# Patient Record
Sex: Female | Born: 1990 | Race: White | Hispanic: No | Marital: Single | Smoking: Former smoker
Health system: Southern US, Community
[De-identification: ages and names within clinical notes are randomized; demographics above are authoritative.]

## PROBLEM LIST (undated history)

## (undated) DIAGNOSIS — Z8759 Personal history of other complications of pregnancy, childbirth and the puerperium: Secondary | ICD-10-CM

## (undated) DIAGNOSIS — O24419 Gestational diabetes mellitus in pregnancy, unspecified control: Secondary | ICD-10-CM

## (undated) HISTORY — DX: Personal history of other complications of pregnancy, childbirth and the puerperium: Z87.59

## (undated) HISTORY — DX: Gestational diabetes mellitus in pregnancy, unspecified control: O24.419

---

## 2010-08-21 ENCOUNTER — Emergency Department: Payer: Self-pay | Admitting: Emergency Medicine

## 2011-11-23 IMAGING — US US OB < 14 WEEKS - US OB TV
1 series · 17 of 28 positions shown · non-contrast
Comparison: none

REASON FOR EXAM: pregnancy, vaginal bleeding and pain
COMMENTS:   May transport without cardiac monitor

[Series 1: us ob < 14 weeks - us ob tv · 17 of 126 slices shown]
[im 1/126]
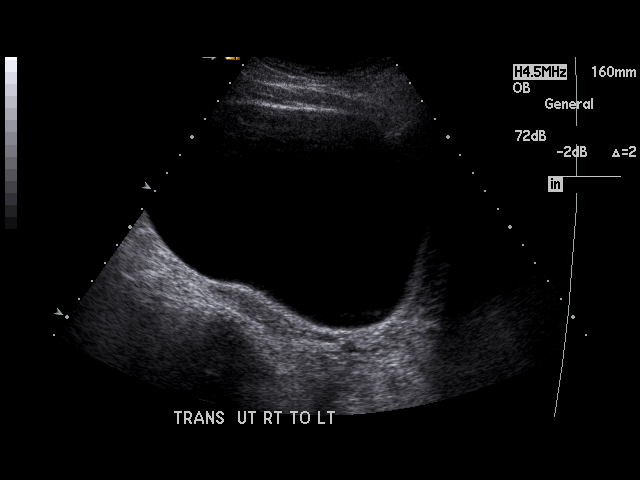
[im 10/126]
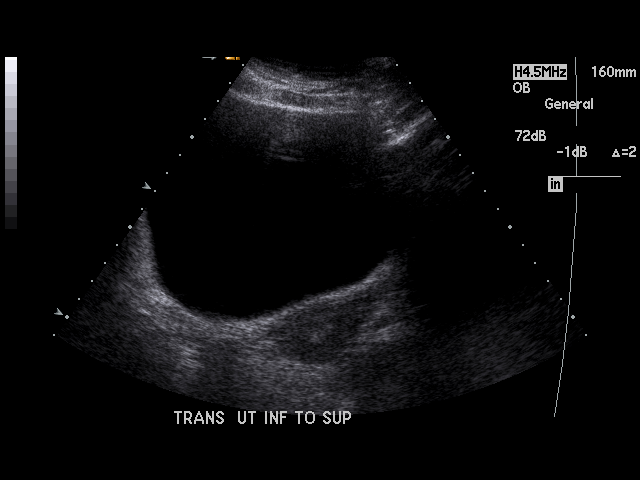
[im 19/126]
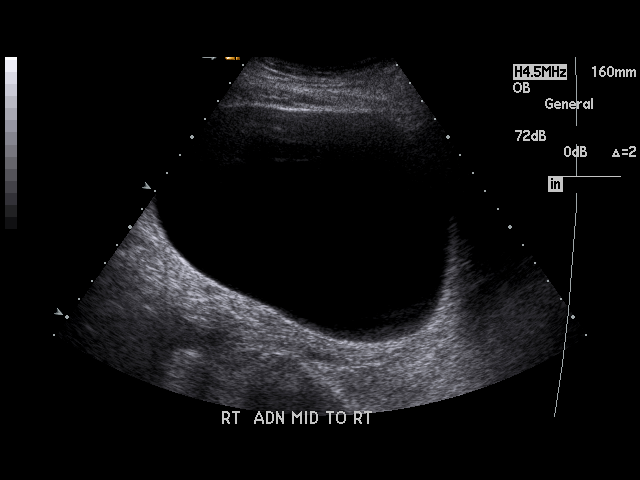
[im 24/126]
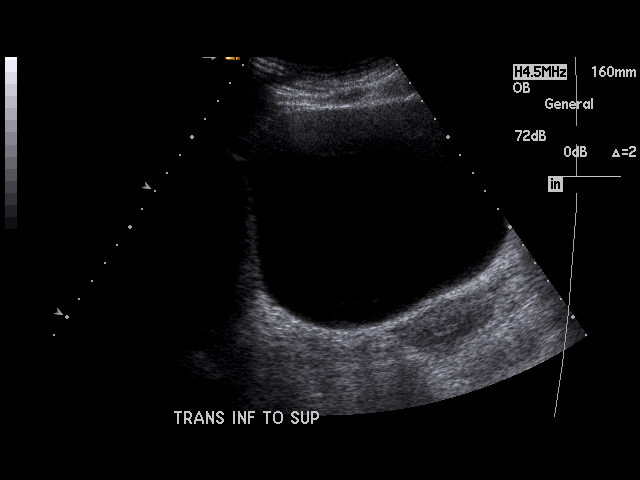
[im 33/126]
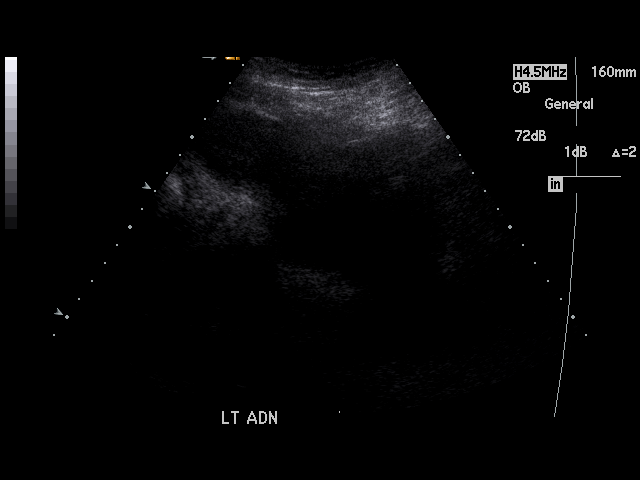
[im 42/126]
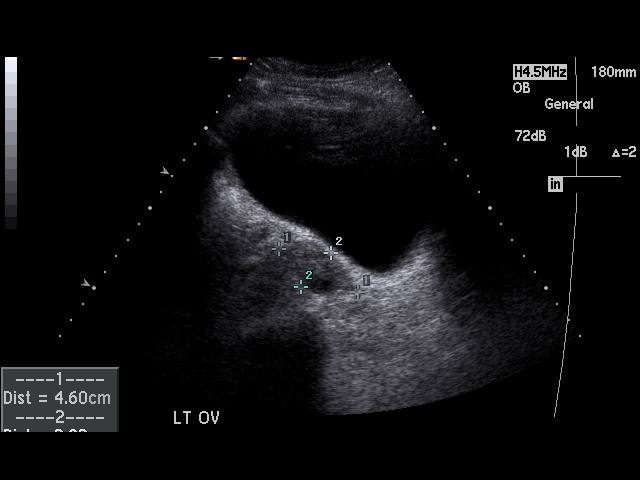
[im 47/126]
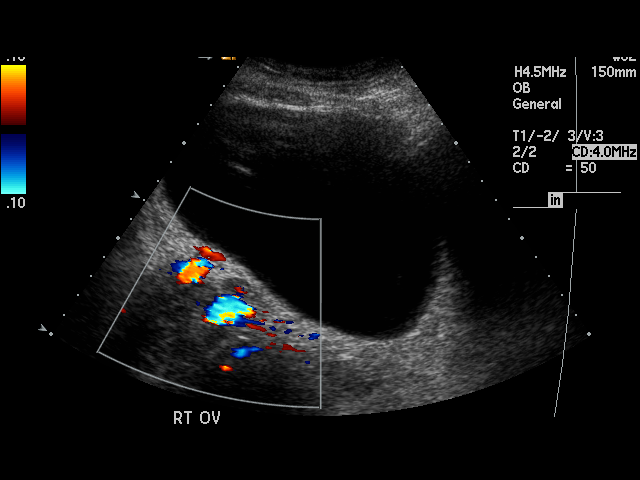
[im 56/126]
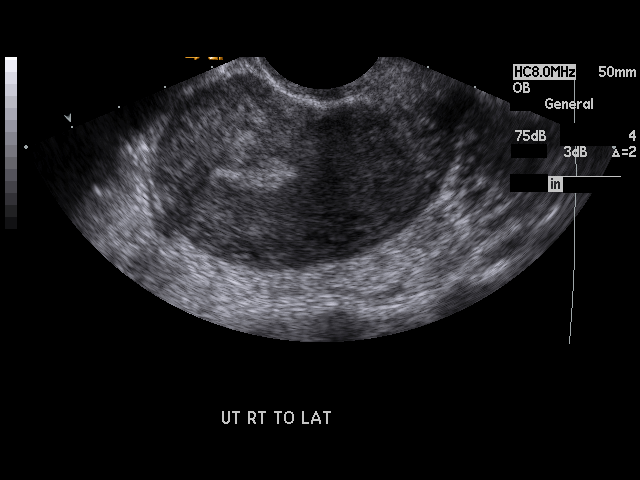
[im 65/126]
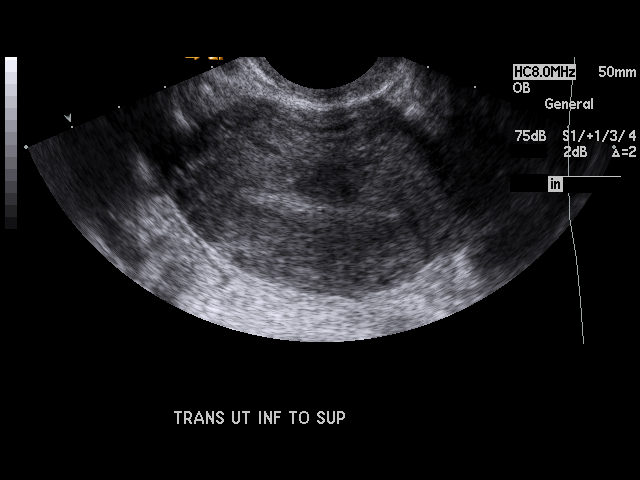
[im 70/126]
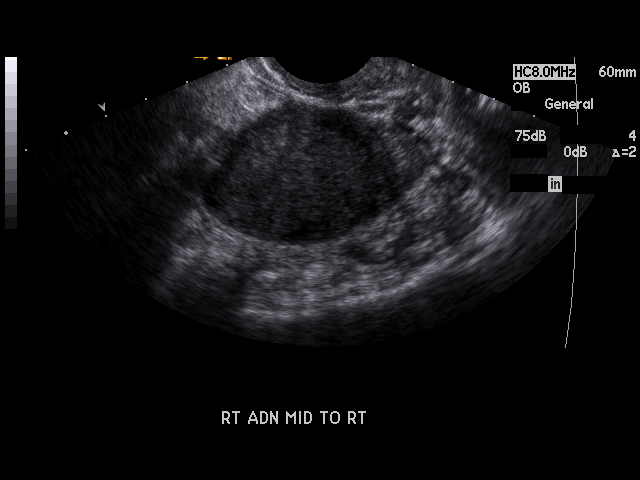
[im 79/126]
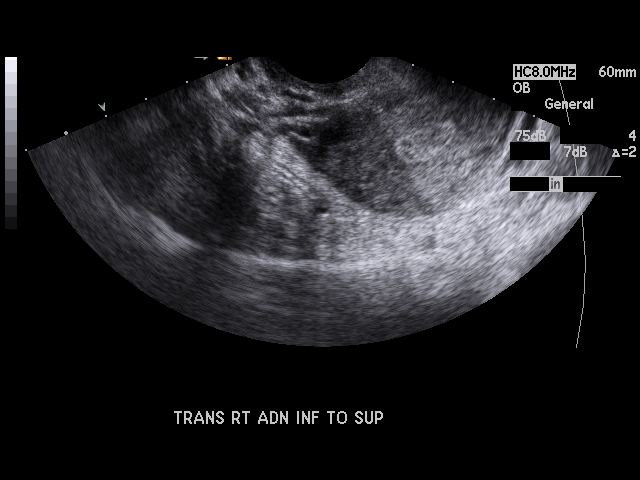
[im 84/126]
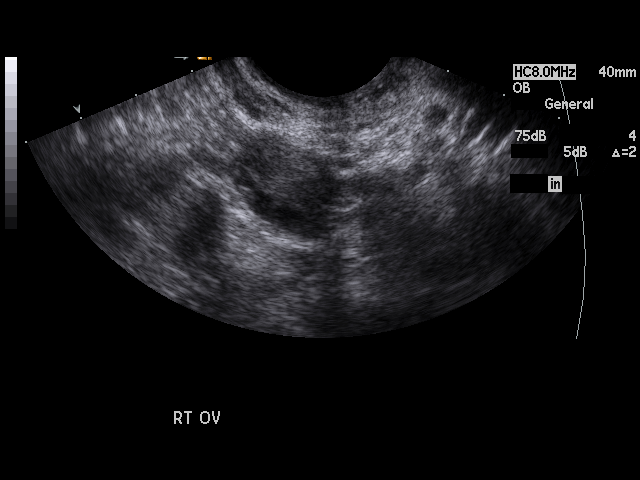
[im 93/126]
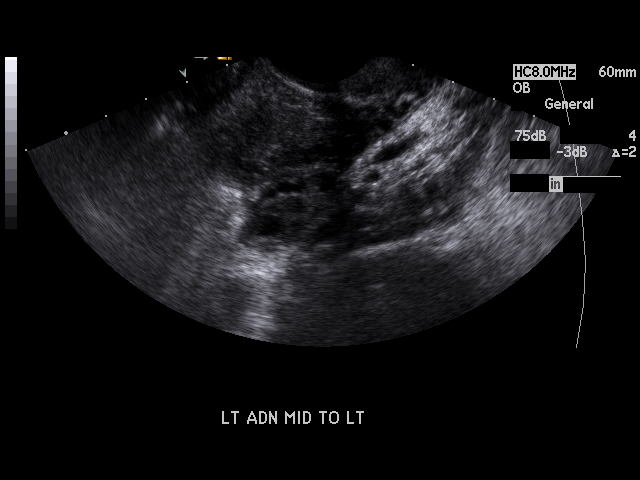
[im 102/126]
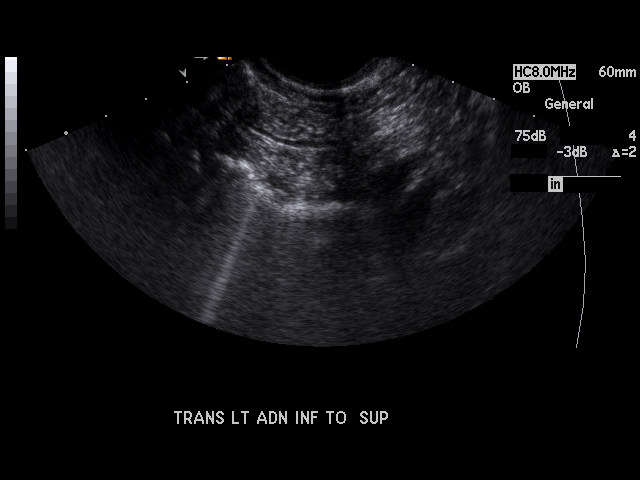
[im 107/126]
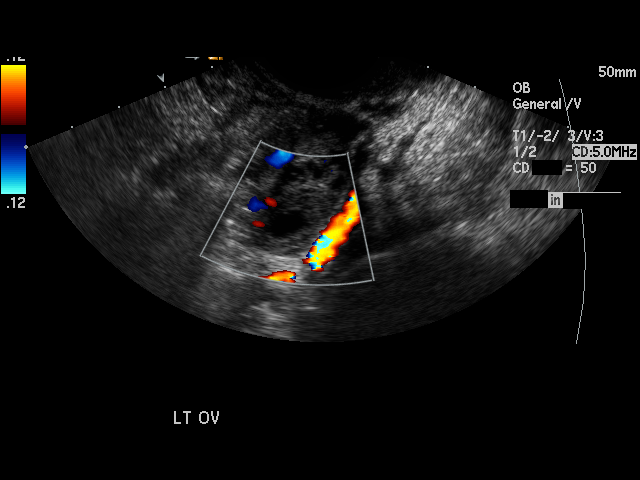
[im 116/126]
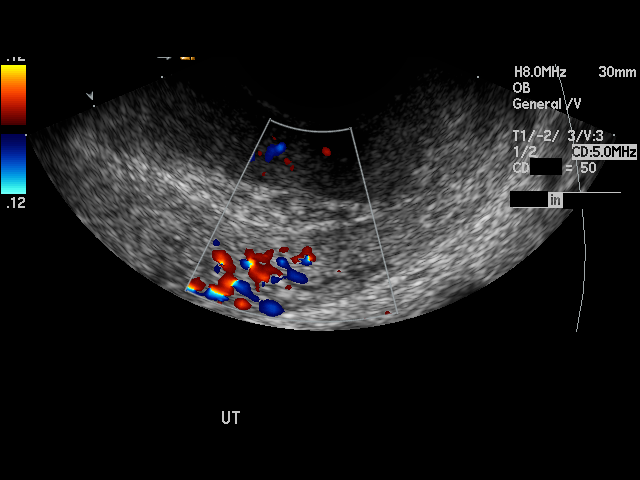
[im 126/126]
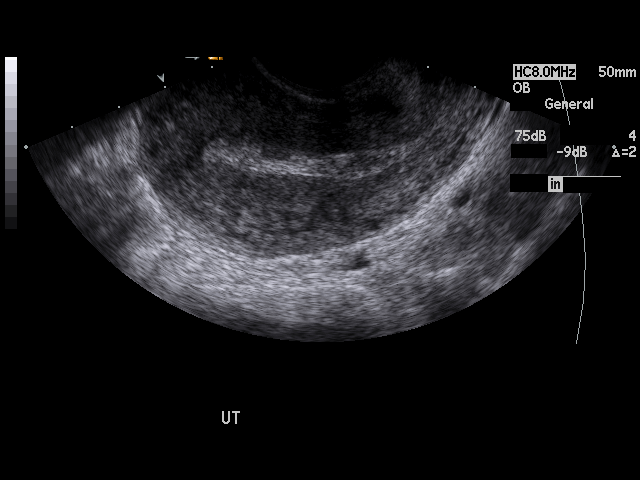

[17 of 28 positions shown; findings below may reference images not displayed]

PROCEDURE:     US  - US OB LESS THAN 14 WEEKS/W TRANS  - August 21, 2010  [DATE]

RESULT:     Transabdominal and endovaginal ultrasound was performed. No
intrauterine products of conception are seen. There is a small amount of
hypoechogenic material within the endometrial cavity. This may represent
residual clot from prior abortion. No uterine mass is seen. The endometrium
measures 5.7 mm in thickness.

Absence of visualization of an embryo in a pregnant patient is consistent
with very early intrauterine gestation that is not yet visible, recent
abortion or ectopic pregnancy.

Continued follow-up is recommended if clinically indicated.

No abnormal adnexal masses are seen. The ovaries are visualized bilaterally
and are normal in appearance. The right ovary measures 2.03 cm at maximum
diameter and the left ovary measures 2.34 cm at maximum diameter. Vascular
flow is noted in each ovary on Doppler examination. Follicular cysts are
noted bilaterally. A trace of free fluid is noted posterior to the uterus.
The visualized portion of the urinary bladder is normal in appearance.
IMPRESSION: 1. No intrauterine gestation is identified.
2. There is a small amount of hypoechoic material in the uterine cavity.
This likely represents residual clot.
3. No intrauterine embryo is seen which may be due to recent abortion but
other possibilities in the differential are noted above.
4. No abnormal adnexal masses are seen.

## 2016-04-13 NOTE — L&D Delivery Note (Signed)
     Delivery Note   Mickle MalloryBrittney D Lo is a 26 y.o. G2P1010 at 9711w1d Estimated Date of Delivery: 02/08/17  PRE-OPERATIVE DIAGNOSIS:  1) 3311w1d pregnancy.   POST-OPERATIVE DIAGNOSIS:  1) 2311w1d pregnancy s/p Vaginal, Spontaneous Delivery   Delivery Type: Vaginal, Spontaneous Delivery    Delivery Anesthesia: Epidural   Labor Complications:    none    ESTIMATED BLOOD LOSS: 350ml    FINDINGS:   1) female infant, Apgar scores of 8    at 1 minute and 9    at 5 minutes and a birthweight of 128.04  ounces.    2) Nuchal cord: yes, x 1 . Reduced   SPECIMENS:   PLACENTA:   Appearance: Intact , 3 vessels, central cord insertion    Removal: Spontaneous      Disposition:   held per protocol then discarded  DISPOSITION:  Infant to left in stable condition in the delivery room, with L&D personnel and mother,  NARRATIVE SUMMARY: Labor course:  Ms. Mickle MalloryBrittney D Hodak is a G2P1010 at 1011w1d who presented for induction of labor due to GDM, diet controlled.  She progressed well in labor with pitocin and AROM. She received the appropriate epidural anesthesia and proceeded to complete dilation. She evidenced good maternal expulsive effort during the second stage. She went on to deliver a viable female infant "Nuala AlphaDalton Phillip". The placenta delivered without problems and was noted to be complete. A perineal and vaginal examination was performed. Episiotomy/Lacerations: 2nd degree and bilateral labial lacerations .  The lacerations were repaired with a 3-0  Vicryl rapide suture using local anesthesia. The patient tolerated this well.  Doreene BurkeAnnie Meili Kleckley, CNM 02/09/2017 1:05 AM

## 2016-06-25 ENCOUNTER — Ambulatory Visit: Payer: 59 | Admitting: Obstetrics and Gynecology

## 2016-06-25 VITALS — BP 115/77 | HR 86 | Ht 63.5 in | Wt 179.6 lb

## 2016-06-25 DIAGNOSIS — Z3A01 Less than 8 weeks gestation of pregnancy: Secondary | ICD-10-CM

## 2016-06-25 DIAGNOSIS — O09299 Supervision of pregnancy with other poor reproductive or obstetric history, unspecified trimester: Secondary | ICD-10-CM

## 2016-06-25 NOTE — Progress Notes (Signed)
Sandy Jenkins presents for NOB nurse interview visit. Pregnancy confirmation done at [facility].  G-2-, P-0 Pregnancy education material explained and given. _No__ cats in the home. NOB labs ordered. Marland Kitchen. HIV labs and Drug screen were explained optional and she did not decline. Drug screen ordered. PNV encouraged. Genetic screening options discussed. Genetic testing Unsure.  Pt may discuss with provider. Pt. To follow up with provider in _5_ weeks for NOB physical.  All questions answered.  I did order patient a dating/viability with her history of miscarriage.

## 2016-06-26 LAB — CBC WITH DIFFERENTIAL/PLATELET
BASOS: 1 %
Basophils Absolute: 0.1 10*3/uL (ref 0.0–0.2)
EOS (ABSOLUTE): 0.7 10*3/uL — ABNORMAL HIGH (ref 0.0–0.4)
Eos: 7 %
Hematocrit: 36.9 % (ref 34.0–46.6)
Hemoglobin: 12.8 g/dL (ref 11.1–15.9)
Immature Grans (Abs): 0 10*3/uL (ref 0.0–0.1)
Immature Granulocytes: 0 %
LYMPHS: 21 %
Lymphocytes Absolute: 2.1 10*3/uL (ref 0.7–3.1)
MCH: 31.8 pg (ref 26.6–33.0)
MCHC: 34.7 g/dL (ref 31.5–35.7)
MCV: 92 fL (ref 79–97)
Monocytes Absolute: 0.5 10*3/uL (ref 0.1–0.9)
Monocytes: 5 %
NEUTROS ABS: 6.3 10*3/uL (ref 1.4–7.0)
Neutrophils: 66 %
PLATELETS: 290 10*3/uL (ref 150–379)
RBC: 4.03 x10E6/uL (ref 3.77–5.28)
RDW: 13.9 % (ref 12.3–15.4)
WBC: 9.6 10*3/uL (ref 3.4–10.8)

## 2016-06-26 LAB — RUBELLA SCREEN

## 2016-06-26 LAB — ANTIBODY SCREEN: Antibody Screen: NEGATIVE

## 2016-06-26 LAB — HEPATITIS B SURFACE ANTIGEN: Hepatitis B Surface Ag: NEGATIVE

## 2016-06-26 LAB — RPR: RPR Ser Ql: NONREACTIVE

## 2016-06-26 LAB — VARICELLA ZOSTER ANTIBODY, IGG: VARICELLA: 1832 {index} (ref >165)

## 2016-06-26 LAB — ABO AND RH: RH TYPE: POSITIVE

## 2016-06-26 LAB — HIV ANTIBODY (ROUTINE TESTING W REFLEX): HIV Screen 4th Generation wRfx: NONREACTIVE

## 2016-06-30 ENCOUNTER — Other Ambulatory Visit: Payer: Self-pay | Admitting: Obstetrics and Gynecology

## 2016-06-30 DIAGNOSIS — O09899 Supervision of other high risk pregnancies, unspecified trimester: Secondary | ICD-10-CM | POA: Insufficient documentation

## 2016-06-30 DIAGNOSIS — O9989 Other specified diseases and conditions complicating pregnancy, childbirth and the puerperium: Principal | ICD-10-CM

## 2016-06-30 DIAGNOSIS — Z283 Underimmunization status: Secondary | ICD-10-CM | POA: Insufficient documentation

## 2016-07-02 ENCOUNTER — Encounter: Payer: Self-pay | Admitting: Obstetrics and Gynecology

## 2016-07-02 ENCOUNTER — Ambulatory Visit (INDEPENDENT_AMBULATORY_CARE_PROVIDER_SITE_OTHER): Payer: 59

## 2016-07-02 DIAGNOSIS — O09299 Supervision of pregnancy with other poor reproductive or obstetric history, unspecified trimester: Secondary | ICD-10-CM

## 2016-08-05 ENCOUNTER — Encounter: Payer: Self-pay | Admitting: Certified Nurse Midwife

## 2016-08-05 ENCOUNTER — Encounter: Payer: Self-pay | Admitting: Obstetrics and Gynecology

## 2016-08-05 ENCOUNTER — Ambulatory Visit (INDEPENDENT_AMBULATORY_CARE_PROVIDER_SITE_OTHER): Payer: 59 | Admitting: Certified Nurse Midwife

## 2016-08-05 VITALS — BP 112/74 | HR 0 | Wt 185.0 lb

## 2016-08-05 DIAGNOSIS — Z3481 Encounter for supervision of other normal pregnancy, first trimester: Secondary | ICD-10-CM | POA: Diagnosis not present

## 2016-08-05 DIAGNOSIS — Z3490 Encounter for supervision of normal pregnancy, unspecified, unspecified trimester: Secondary | ICD-10-CM | POA: Insufficient documentation

## 2016-08-05 DIAGNOSIS — Z3A12 12 weeks gestation of pregnancy: Secondary | ICD-10-CM

## 2016-08-05 DIAGNOSIS — O2341 Unspecified infection of urinary tract in pregnancy, first trimester: Secondary | ICD-10-CM

## 2016-08-05 DIAGNOSIS — Z124 Encounter for screening for malignant neoplasm of cervix: Secondary | ICD-10-CM

## 2016-08-05 LAB — POCT URINALYSIS DIPSTICK
Bilirubin, UA: NEGATIVE
Blood, UA: NEGATIVE
Glucose, UA: NEGATIVE
KETONES UA: NEGATIVE
Leukocytes, UA: NEGATIVE
Nitrite, UA: POSITIVE
PH UA: 6 (ref 5.0–8.0)
PROTEIN UA: NEGATIVE
SPEC GRAV UA: 1.01 (ref 1.010–1.025)
UROBILINOGEN UA: NEGATIVE U/dL — AB

## 2016-08-05 MED ORDER — NITROFURANTOIN MONOHYD MACRO 100 MG PO CAPS: 100.0000 mg | ORAL_CAPSULE | Freq: Two times a day (BID) | ORAL | 0 refills | Status: DC

## 2016-08-05 NOTE — Addendum Note (Signed)
Addended by: Jackquline Denmark on: 08/05/2016 11:20 AM   Modules accepted: Orders

## 2016-08-05 NOTE — Progress Notes (Signed)
NEW OB HISTORY AND PHYSICAL  SUBJECTIVE:       Sandy Jenkins is a 26 y.o. G95P0010 female, Patient's last menstrual period was 05/12/2016., Estimated Date of Delivery: 02/16/17, 108w1d, presents today for establishment of Prenatal Care. She has no unusual complaints.   Gynecologic History Patient's last menstrual period was 05/12/2016. Normal Contraception: none Last Pap: Its been a while. Results were: normal per pt.   Obstetric History OB History  Gravida Para Term Preterm AB Living  2       1    SAB TAB Ectopic Multiple Live Births  1            # Outcome Date GA Lbr Len/2nd Weight Sex Delivery Anes PTL Lv  2 Current           1 SAB               Past Medical History:  Diagnosis Date  . History of miscarriage     History reviewed. No pertinent surgical history.  Current Outpatient Prescriptions on File Prior to Visit  Medication Sig Dispense Refill  . Prenatal Vit-Fe Fumarate-FA (PRENATAL MULTIVITAMIN) TABS tablet Take 1 tablet by mouth daily at 12 noon.     No current facility-administered medications on file prior to visit.     Allergies  Allergen Reactions  . Penicillins     Social History   Social History  . Marital status: Single    Spouse name: N/A  . Number of children: N/A  . Years of education: N/A   Occupational History  . Not on file.   Social History Main Topics  . Smoking status: Former Games developer  . Smokeless tobacco: Never Used  . Alcohol use No  . Drug use: No  . Sexual activity: Yes    Birth control/ protection: None   Other Topics Concern  . Not on file   Social History Narrative  . No narrative on file    History reviewed. No pertinent family history.  The following portions of the patient's history were reviewed and updated as appropriate: allergies, current medications, past OB history, past medical history, past surgical history, past family history, past social history, and problem list.    OBJECTIVE: Initial Physical  Exam (New OB)  GENERAL APPEARANCE: alert, well appearing, in no apparent distress HEAD: normocephalic, atraumatic MOUTH: mucous membranes moist, pharynx normal without lesions THYROID: no thyromegaly or masses present BREASTS: no masses noted, no significant tenderness, no palpable axillary nodes, no skin changes LUNGS: clear to auscultation, no wheezes, rales or rhonchi, symmetric air entry HEART: regular rate and rhythm, no murmurs ABDOMEN: soft, nontender, nondistended, no abnormal masses, no epigastric pain EXTREMITIES: no redness or tenderness in the calves or thighs SKIN: normal coloration and turgor, no rashes LYMPH NODES: no adenopathy palpable NEUROLOGIC: alert, oriented, normal speech, no focal findings or movement disorder noted  PELVIC EXAM EXTERNAL GENITALIA: normal appearing vulva with no masses, tenderness or lesions VAGINA: no abnormal discharge or lesions CERVIX: no lesions or cervical motion tenderness and contact bleeding with pap UTERUS: gravid ADNEXA: no masses palpable and nontender OB EXAM PELVIMETRY: appears adequate RECTUM: exam not indicated  ASSESSMENT: Normal pregnancy  PLAN: Prenatal care New OB counseling: The patient has been given an overview regarding routine prenatal care. Recommendations regarding diet, weight gain, and exercise in pregnancy were given. Prenatal testing, optional genetic testing, and ultrasound use in pregnancy were reviewed.  Benefits of Breast Feeding were discussed. The patient is encouraged to consider  nursing her baby post partum. Urine showed positive nitrites , prescription sent to pharmacy. ROB in 4 wks  Doreene Burke, CNM

## 2016-08-05 NOTE — Patient Instructions (Signed)

## 2016-08-07 ENCOUNTER — Encounter: Payer: 59 | Admitting: Certified Nurse Midwife

## 2016-08-08 ENCOUNTER — Other Ambulatory Visit: Payer: Self-pay | Admitting: Certified Nurse Midwife

## 2016-08-08 ENCOUNTER — Encounter: Payer: Self-pay | Admitting: Certified Nurse Midwife

## 2016-08-08 LAB — URINE CULTURE, OB REFLEX

## 2016-08-08 LAB — CULTURE, OB URINE

## 2016-08-11 ENCOUNTER — Encounter: Payer: Self-pay | Admitting: Certified Nurse Midwife

## 2016-08-11 LAB — PAP IG W/ RFLX HPV ASCU: PAP Smear Comment: 0

## 2016-09-04 ENCOUNTER — Encounter: Payer: Self-pay | Admitting: Obstetrics and Gynecology

## 2016-09-04 ENCOUNTER — Ambulatory Visit (INDEPENDENT_AMBULATORY_CARE_PROVIDER_SITE_OTHER): Payer: 59 | Admitting: Obstetrics and Gynecology

## 2016-09-04 VITALS — BP 127/80 | HR 100 | Wt 190.8 lb

## 2016-09-04 DIAGNOSIS — E669 Obesity, unspecified: Secondary | ICD-10-CM | POA: Insufficient documentation

## 2016-09-04 DIAGNOSIS — Z3492 Encounter for supervision of normal pregnancy, unspecified, second trimester: Secondary | ICD-10-CM

## 2016-09-04 LAB — POCT URINALYSIS DIPSTICK
BILIRUBIN UA: NEGATIVE
GLUCOSE UA: NEGATIVE
Ketones, UA: NEGATIVE
LEUKOCYTES UA: NEGATIVE
NITRITE UA: NEGATIVE
Protein, UA: NEGATIVE
RBC UA: NEGATIVE
Spec Grav, UA: 1.015 (ref 1.010–1.025)
UROBILINOGEN UA: 0.2 U/dL
pH, UA: 6 (ref 5.0–8.0)

## 2016-09-04 NOTE — Progress Notes (Signed)
ROB- pt is doing well 

## 2016-09-04 NOTE — Progress Notes (Signed)
ROB- doing well, anatomy scan next visit 

## 2016-10-02 ENCOUNTER — Other Ambulatory Visit: Payer: Self-pay

## 2016-10-02 ENCOUNTER — Encounter: Payer: 59 | Admitting: Certified Nurse Midwife

## 2016-10-05 ENCOUNTER — Ambulatory Visit (INDEPENDENT_AMBULATORY_CARE_PROVIDER_SITE_OTHER): Payer: 59 | Admitting: Certified Nurse Midwife

## 2016-10-05 ENCOUNTER — Encounter: Payer: Self-pay | Admitting: Certified Nurse Midwife

## 2016-10-05 ENCOUNTER — Ambulatory Visit (INDEPENDENT_AMBULATORY_CARE_PROVIDER_SITE_OTHER): Payer: 59

## 2016-10-05 VITALS — BP 121/73 | HR 85 | Wt 193.2 lb

## 2016-10-05 DIAGNOSIS — Z3492 Encounter for supervision of normal pregnancy, unspecified, second trimester: Secondary | ICD-10-CM

## 2016-10-05 DIAGNOSIS — O99619 Diseases of the digestive system complicating pregnancy, unspecified trimester: Secondary | ICD-10-CM

## 2016-10-05 DIAGNOSIS — K219 Gastro-esophageal reflux disease without esophagitis: Secondary | ICD-10-CM

## 2016-10-05 LAB — POCT URINALYSIS DIPSTICK
BILIRUBIN UA: NEGATIVE
Blood, UA: NEGATIVE
Glucose, UA: NEGATIVE
KETONES UA: NEGATIVE
LEUKOCYTES UA: NEGATIVE
Nitrite, UA: NEGATIVE
Protein, UA: NEGATIVE
Spec Grav, UA: 1.01 (ref 1.010–1.025)
Urobilinogen, UA: 0.2 E.U./dL
pH, UA: 6.5 (ref 5.0–8.0)

## 2016-10-05 MED ORDER — RANITIDINE HCL 150 MG PO TABS: 150.0000 mg | ORAL_TABLET | Freq: Two times a day (BID) | ORAL | 3 refills | Status: DC

## 2016-10-05 NOTE — Progress Notes (Signed)
ROB-Pt doing well, reports reflux and discomfort with breathing. Heart RRR, Lungs CTAB. Discussed home treatment measures including position change, Tums, and Zantac. Rx: Zantac, see orders. Pt accompanied by spouse. Anatomy scan completed early today. Findings reviewed-complete/normal, verbalized understanding. Anticipatory guidance regarding round ligament pain and treatment measures. Reviewed red flag symptoms and when to call. RTC x 4 weeks for ROB or sooner if needed.   ULTRASOUND REPORT  Location: [facility] Date of Service: 10/05/16  Indications:Anatomy U/S Findings:  Mason JimSingleton intrauterine pregnancy is visualized with FHR at 143 BPM. Biometrics give an (U/S) Gestational age of [redacted] weeks and an (U/S) EDD of 02/08/17; this correlates with the clinically established EDD of 02/16/17.  Fetal presentation is Breech.  EFW: 459g (1lb). Placenta: Posterior, grade 0, 7.8 cm from internal os, central cord insert. AFI: Adequate with MVP of 5.1 cm.  Anatomic survey is complete and normal; Gender - female  .   Ovaries are not seen. Survey of the adnexa demonstrates no adnexal masses. There is no free peritoneal fluid in the cul de sac.  Impression: 1. 22 week Viable Singleton Intrauterine pregnancy by U/S. 2. (U/S) EDD is consistent with Clinically established (LMP) EDD of 02/16/17. 3. Normal Anatomy Scan  Recommendations: 1.Clinical correlation with the patient's History and Physical Exam.

## 2016-10-05 NOTE — Addendum Note (Signed)
Addended by: Shaune SpittleLAWHORN, Duwane Gewirtz M on: 10/05/2016 04:50 PM   Modules accepted: Orders

## 2016-10-05 NOTE — Progress Notes (Signed)
Pt is c/o heartburn.

## 2016-10-05 NOTE — Patient Instructions (Signed)
Common Medications Safe in Pregnancy  Acne:      Constipation:  Benzoyl Peroxide     Colace  Clindamycin      Dulcolax Suppository  Topica Erythromycin     Fibercon  Salicylic Acid      Metamucil         Miralax AVOID:        Senakot   Accutane    Cough:  Retin-A       Cough Drops  Tetracycline      Phenergan w/ Codeine if Rx  Minocycline      Robitussin (Plain & DM)  Antibiotics:     Crabs/Lice:  Ceclor       RID  Cephalosporins    AVOID:  E-Mycins      Kwell  Keflex  Macrobid/Macrodantin   Diarrhea:  Penicillin      Kao-Pectate  Zithromax      Imodium AD         PUSH FLUIDS AVOID:       Cipro     Fever:  Tetracycline      Tylenol (Regular or Extra  Minocycline       Strength)  Levaquin      Extra Strength-Do not          Exceed 8 tabs/24 hrs Caffeine:        <200mg/day (equiv. To 1 cup of coffee or  approx. 3 12 oz sodas)         Gas: Cold/Hayfever:       Gas-X  Benadryl      Mylicon  Claritin       Phazyme  **Claritin-D        Chlor-Trimeton    Headaches:  Dimetapp      ASA-Free Excedrin  Drixoral-Non-Drowsy     Cold Compress  Mucinex (Guaifenasin)     Tylenol (Regular or Extra  Sudafed/Sudafed-12 Hour     Strength)  **Sudafed PE Pseudoephedrine   Tylenol Cold & Sinus     Vicks Vapor Rub  Zyrtec  **AVOID if Problems With Blood Pressure         Heartburn: Avoid lying down for at least 1 hour after meals  Aciphex      Maalox     Rash:  Milk of Magnesia     Benadryl    Mylanta       1% Hydrocortisone Cream  Pepcid  Pepcid Complete   Sleep Aids:  Prevacid      Ambien   Prilosec       Benadryl  Rolaids       Chamomile Tea  Tums (Limit 4/day)     Unisom  Zantac       Tylenol PM         Warm milk-add vanilla or  Hemorrhoids:       Sugar for taste  Anusol/Anusol H.C.  (RX: Analapram 2.5%)  Sugar Substitutes:  Hydrocortisone OTC     Ok in moderation  Preparation H      Tucks        Vaseline lotion applied to tissue with  wiping    Herpes:     Throat:  Acyclovir      Oragel  Famvir  Valtrex     Vaccines:         Flu Shot Leg Cramps:       *Gardasil  Benadryl      Hepatitis A         Hepatitis B Nasal Spray:         Pneumovax  Saline Nasal Spray     Polio Booster         Tetanus Nausea:       Tuberculosis test or PPD  Vitamin B6 25 mg TID   AVOID:    Dramamine      *Gardasil  Emetrol       Live Poliovirus  Ginger Root 250 mg QID    MMR (measles, mumps &  High Complex Carbs @ Bedtime    rebella)  Sea Bands-Accupressure    Varicella (Chickenpox)  Unisom 1/2 tab TID     *No known complications           If received before Pain:         Known pregnancy;   Darvocet       Resume series after  Lortab        Delivery  Percocet    Yeast:   Tramadol      Femstat  Tylenol 3      Gyne-lotrimin  Ultram       Monistat  Vicodin           MISC:         All Sunscreens           Hair Coloring/highlights          Insect Repellant's          (Including DEET)         Mystic Tans Round Ligament Pain The round ligament is a cord of muscle and tissue that helps to support the uterus. It can become a source of pain during pregnancy if it becomes stretched or twisted as the baby grows. The pain usually begins in the second trimester of pregnancy, and it can come and go until the baby is delivered. It is not a serious problem, and it does not cause harm to the baby. Round ligament pain is usually a short, sharp, and pinching pain, but it can also be a dull, lingering, and aching pain. The pain is felt in the lower side of the abdomen or in the groin. It usually starts deep in the groin and moves up to the outside of the hip area. Pain can occur with:  A sudden change in position.  Rolling over in bed.  Coughing or sneezing.  Physical activity.  Follow these instructions at home: Watch your condition for any changes. Take these steps to help with your pain:  When the pain starts, relax. Then try: ? Sitting  down. ? Flexing your knees up to your abdomen. ? Lying on your side with one pillow under your abdomen and another pillow between your legs. ? Sitting in a warm bath for 15-20 minutes or until the pain goes away.  Take over-the-counter and prescription medicines only as told by your health care provider.  Move slowly when you sit and stand.  Avoid long walks if they cause pain.  Stop or lessen your physical activities if they cause pain.  Contact a health care provider if:  Your pain does not go away with treatment.  You feel pain in your back that you did not have before.  Your medicine is not helping. Get help right away if:  You develop a fever or chills.  You develop uterine contractions.  You develop vaginal bleeding.  You develop nausea or vomiting.  You develop diarrhea.  You have pain when you urinate. This information is not intended to replace advice given to you by your health   care provider. Make sure you discuss any questions you have with your health care provider. Document Released: 01/07/2008 Document Revised: 09/05/2015 Document Reviewed: 06/06/2014 Elsevier Interactive Patient Education  2018 [employer] Second Trimester of Pregnancy The second trimester is from week 14 through week 27 (months 4 through 6). The second trimester is often a time when you feel your best. Your body has adjusted to being pregnant, and you begin to feel better physically. Usually, morning sickness has lessened or quit completely, you may have more energy, and you may have an increase in appetite. The second trimester is also a time when the fetus is growing rapidly. At the end of the sixth month, the fetus is about 9 inches long and weighs about 1 pounds. You will likely begin to feel the baby move (quickening) between 16 and 20 weeks of pregnancy. Body changes during your second trimester Your body continues to go through many changes during your second trimester. The changes  vary from woman to woman.  Your weight will continue to increase. You will notice your lower abdomen bulging out.  You may begin to get stretch marks on your hips, abdomen, and breasts.  You may develop headaches that can be relieved by medicines. The medicines should be approved by your health care provider.  You may urinate more often because the fetus is pressing on your bladder.  You may develop or continue to have heartburn as a result of your pregnancy.  You may develop constipation because certain hormones are causing the muscles that push waste through your intestines to slow down.  You may develop hemorrhoids or swollen, bulging veins (varicose veins).  You may have back pain. This is caused by: ? Weight gain. ? Pregnancy hormones that are relaxing the joints in your pelvis. ? A shift in weight and the muscles that support your balance.  Your breasts will continue to grow and they will continue to become tender.  Your gums may bleed and may be sensitive to brushing and flossing.  Dark spots or blotches (chloasma, mask of pregnancy) may develop on your face. This will likely fade after the baby is born.  A dark line from your belly button to the pubic area (linea nigra) may appear. This will likely fade after the baby is born.  You may have changes in your hair. These can include thickening of your hair, rapid growth, and changes in texture. Some women also have hair loss during or after pregnancy, or hair that feels dry or thin. Your hair will most likely return to normal after your baby is born.  What to expect at prenatal visits During a routine prenatal visit:  You will be weighed to make sure you and the fetus are growing normally.  Your blood pressure will be taken.  Your abdomen will be measured to track your baby's growth.  The fetal heartbeat will be listened to.  Any test results from the previous visit will be discussed.  Your health care provider may  ask you:  How you are feeling.  If you are feeling the baby move.  If you have had any abnormal symptoms, such as leaking fluid, bleeding, severe headaches, or abdominal cramping.  If you are using any tobacco products, including cigarettes, chewing tobacco, and electronic cigarettes.  If you have any questions.  Other tests that may be performed during your second trimester include:  Blood tests that check for: ? Low iron levels (anemia). ? High blood sugar that affects pregnant   women (gestational diabetes) between 24 and 28 weeks. ? Rh antibodies. This is to check for a protein on red blood cells (Rh factor).  Urine tests to check for infections, diabetes, or protein in the urine.  An ultrasound to confirm the proper growth and development of the baby.  An amniocentesis to check for possible genetic problems.  Fetal screens for spina bifida and Down syndrome.  HIV (human immunodeficiency virus) testing. Routine prenatal testing includes screening for HIV, unless you choose not to have this test.  Follow these instructions at home: Medicines  Follow your health care provider's instructions regarding medicine use. Specific medicines may be either safe or unsafe to take during pregnancy.  Take a prenatal vitamin that contains at least 600 micrograms (mcg) of folic acid.  If you develop constipation, try taking a stool softener if your health care provider approves. Eating and drinking  Eat a balanced diet that includes fresh fruits and vegetables, whole grains, good sources of protein such as meat, eggs, or tofu, and low-fat dairy. Your health care provider will help you determine the amount of weight gain that is right for you.  Avoid raw meat and uncooked cheese. These carry germs that can cause birth defects in the baby.  If you have low calcium intake from food, talk to your health care provider about whether you should take a daily calcium supplement.  Limit foods  that are high in fat and processed sugars, such as fried and sweet foods.  To prevent constipation: ? Drink enough fluid to keep your urine clear or pale yellow. ? Eat foods that are high in fiber, such as fresh fruits and vegetables, whole grains, and beans. Activity  Exercise only as directed by your health care provider. Most women can continue their usual exercise routine during pregnancy. Try to exercise for 30 minutes at least 5 days a week. Stop exercising if you experience uterine contractions.  Avoid heavy lifting, wear low heel shoes, and practice good posture.  A sexual relationship may be continued unless your health care provider directs you otherwise. Relieving pain and discomfort  Wear a good support bra to prevent discomfort from breast tenderness.  Take warm sitz baths to soothe any pain or discomfort caused by hemorrhoids. Use hemorrhoid cream if your health care provider approves.  Rest with your legs elevated if you have leg cramps or low back pain.  If you develop varicose veins, wear support hose. Elevate your feet for 15 minutes, 3-4 times a day. Limit salt in your diet. Prenatal Care  Write down your questions. Take them to your prenatal visits.  Keep all your prenatal visits as told by your health care provider. This is important. Safety  Wear your seat belt at all times when driving.  Make a list of emergency phone numbers, including numbers for family, friends, the hospital, and police and fire departments. General instructions  Ask your health care provider for a referral to a local prenatal education class. Begin classes no later than the beginning of month 6 of your pregnancy.  Ask for help if you have counseling or nutritional needs during pregnancy. Your health care provider can offer advice or refer you to specialists for help with various needs.  Do not use hot tubs, steam rooms, or saunas.  Do not douche or use tampons or scented sanitary  pads.  Do not cross your legs for long periods of time.  Avoid cat litter boxes and soil used by cats. These carry   germs that can cause birth defects in the baby and possibly loss of the fetus by miscarriage or stillbirth.  Avoid all smoking, herbs, alcohol, and unprescribed drugs. Chemicals in these products can affect the formation and growth of the baby.  Do not use any products that contain nicotine or tobacco, such as cigarettes and e-cigarettes. If you need help quitting, ask your health care provider.  Visit your dentist if you have not gone yet during your pregnancy. Use a soft toothbrush to brush your teeth and be gentle when you floss. Contact a health care provider if:  You have dizziness.  You have mild pelvic cramps, pelvic pressure, or nagging pain in the abdominal area.  You have persistent nausea, vomiting, or diarrhea.  You have a bad smelling vaginal discharge.  You have pain when you urinate. Get help right away if:  You have a fever.  You are leaking fluid from your vagina.  You have spotting or bleeding from your vagina.  You have severe abdominal cramping or pain.  You have rapid weight gain or weight loss.  You have shortness of breath with chest pain.  You notice sudden or extreme swelling of your face, hands, ankles, feet, or legs.  You have not felt your baby move in over an hour.  You have severe headaches that do not go away when you take medicine.  You have vision changes. Summary  The second trimester is from week 14 through week 27 (months 4 through 6). It is also a time when the fetus is growing rapidly.  Your body goes through many changes during pregnancy. The changes vary from woman to woman.  Avoid all smoking, herbs, alcohol, and unprescribed drugs. These chemicals affect the formation and growth your baby.  Do not use any tobacco products, such as cigarettes, chewing tobacco, and e-cigarettes. If you need help quitting, ask your  health care provider.  Contact your health care provider if you have any questions. Keep all prenatal visits as told by your health care provider. This is important. This information is not intended to replace advice given to you by your health care provider. Make sure you discuss any questions you have with your health care provider. Document Released: 03/24/2001 Document Revised: 09/05/2015 Document Reviewed: 05/31/2012 Elsevier Interactive Patient Education  2017 [employer]  

## 2016-11-02 ENCOUNTER — Encounter: Payer: Self-pay | Admitting: Certified Nurse Midwife

## 2016-11-02 ENCOUNTER — Ambulatory Visit (INDEPENDENT_AMBULATORY_CARE_PROVIDER_SITE_OTHER): Payer: Medicaid Other | Admitting: Certified Nurse Midwife

## 2016-11-02 VITALS — BP 110/82 | HR 98 | Wt 193.1 lb

## 2016-11-02 DIAGNOSIS — R82998 Other abnormal findings in urine: Secondary | ICD-10-CM

## 2016-11-02 DIAGNOSIS — R8299 Other abnormal findings in urine: Secondary | ICD-10-CM

## 2016-11-02 DIAGNOSIS — Z3482 Encounter for supervision of other normal pregnancy, second trimester: Secondary | ICD-10-CM | POA: Diagnosis not present

## 2016-11-02 LAB — POCT URINALYSIS DIPSTICK
Bilirubin, UA: NEGATIVE
Glucose, UA: NEGATIVE
KETONES UA: NEGATIVE
Nitrite, UA: NEGATIVE
PROTEIN UA: NEGATIVE
Spec Grav, UA: 1.01 (ref 1.010–1.025)
UROBILINOGEN UA: 0.2 U/dL
pH, UA: 7 (ref 5.0–8.0)

## 2016-11-02 NOTE — Progress Notes (Signed)
ROB, doing well. No complaints. Discussed 1 hr GTT, CBC, TDap, & BTC form at next visit. Also reviewed. That she will start to come q 2 weeks after her next visit. She verbalizes understanding and agrees to plan . Follow up in 4 wks.   Sandy BurkeAnnie Emari Jenkins, CNM

## 2016-11-02 NOTE — Patient Instructions (Addendum)
Round Ligament Pain The round ligament is a cord of muscle and tissue that helps to support the uterus. It can become a source of pain during pregnancy if it becomes stretched or twisted as the baby grows. The pain usually begins in the second trimester of pregnancy, and it can come and go until the baby is delivered. It is not a serious problem, and it does not cause harm to the baby. Round ligament pain is usually a short, sharp, and pinching pain, but it can also be a dull, lingering, and aching pain. The pain is felt in the lower side of the abdomen or in the groin. It usually starts deep in the groin and moves up to the outside of the hip area. Pain can occur with:  A sudden change in position.  Rolling over in bed.  Coughing or sneezing.  Physical activity.  Follow these instructions at home: Watch your condition for any changes. Take these steps to help with your pain:  When the pain starts, relax. Then try: ? Sitting down. ? Flexing your knees up to your abdomen. ? Lying on your side with one pillow under your abdomen and another pillow between your legs. ? Sitting in a warm bath for 15-20 minutes or until the pain goes away.  Take over-the-counter and prescription medicines only as told by your health care provider.  Move slowly when you sit and stand.  Avoid long walks if they cause pain.  Stop or lessen your physical activities if they cause pain.  Contact a health care provider if:  Your pain does not go away with treatment.  You feel pain in your back that you did not have before.  Your medicine is not helping. Get help right away if:  You develop a fever or chills.  You develop uterine contractions.  You develop vaginal bleeding.  You develop nausea or vomiting.  You develop diarrhea.  You have pain when you urinate. This information is not intended to replace advice given to you by your health care provider. Make sure you discuss any questions you have  with your health care provider. Document Released: 01/07/2008 Document Revised: 09/05/2015 Document Reviewed: 06/06/2014 Elsevier Interactive Patient Education  2018 [employer] Glucose Tolerance Test During Pregnancy The glucose tolerance test (GTT) is a blood test used to determine if you have developed a type of diabetes during pregnancy (gestational diabetes). This is when your body does not properly process sugar (glucose) in the food you eat, resulting in high blood glucose levels. Typically, a GTT is done after you have had a 1-hour glucose test with results that indicate you possibly have gestational diabetes. It may also be done if:  You have a history of giving birth to very large babies or have experienced repeated fetal loss (stillbirth).  You have signs and symptoms of diabetes, such as: ? Changes in your vision. ? Tingling or numbness in your hands or feet. ? Changes in hunger, thirst, and urination not otherwise explained by your pregnancy.  The GTT lasts about 3 hours. You will be given a sugar-water solution to drink at the beginning of the test. You will have blood drawn before you drink the solution and then again 1, 2, and 3 hours after you drink it. You will not be allowed to eat or drink anything else during the test. You must remain at the testing location to make sure that your blood is drawn on time. You should also avoid exercising during the  test, because exercise can alter test results. How do I prepare for this test? Eat normally for 3 days prior to the GTT test, including having plenty of carbohydrate-rich foods. Do not eat or drink anything except water during the final 12 hours before the test. In addition, your health care provider may ask you to stop taking certain medicines before the test. What do the results mean? It is your responsibility to obtain your test results. Ask the lab or department performing the test when and how you will get your results. Contact  your health care provider to discuss any questions you have about your results. Range of Normal Values Ranges for normal values may vary among different labs and hospitals. You should always check with your health care provider after having lab work or other tests done to discuss whether your values are considered within normal limits. Normal levels of blood glucose are as follows:  Fasting: less than 105 mg/dL.  1 hour after drinking the solution: less than 190 mg/dL.  2 hours after drinking the solution: less than 165 mg/dL.  3 hours after drinking the solution: less than 145 mg/dL.  Some substances can interfere with GTT results. These may include:  Blood pressure and heart failure medicines, including beta blockers, furosemide, and thiazides.  Anti-inflammatory medicines, including aspirin.  Nicotine.  Some psychiatric medicines.  Meaning of Results Outside Normal Value Ranges GTT test results that are above normal values may indicate a number of health problems, such as:  Gestational diabetes.  Acute stress response.  Cushing syndrome.  Tumors such as pheochromocytoma or glucagonoma.  Long-term kidney problems.  Pancreatitis.  Hyperthyroidism.  Current infection.  Discuss your test results with your health care provider. He or she will use the results to make a diagnosis and determine a treatment plan that is right for you. This information is not intended to replace advice given to you by your health care provider. Make sure you discuss any questions you have with your health care provider. Document Released: 09/29/2011 Document Revised: 09/05/2015 Document Reviewed: 08/04/2013 Elsevier Interactive Patient Education  2017 [employer].

## 2016-11-03 ENCOUNTER — Encounter: Payer: 59 | Admitting: Certified Nurse Midwife

## 2016-11-04 LAB — URINE CULTURE: Organism ID, Bacteria: NO GROWTH

## 2016-12-01 ENCOUNTER — Other Ambulatory Visit: Payer: Medicaid Other

## 2016-12-01 ENCOUNTER — Encounter: Payer: Medicaid Other | Admitting: Obstetrics and Gynecology

## 2016-12-03 ENCOUNTER — Encounter: Payer: Self-pay | Admitting: Certified Nurse Midwife

## 2016-12-03 ENCOUNTER — Other Ambulatory Visit: Payer: Medicaid Other

## 2016-12-03 ENCOUNTER — Ambulatory Visit (INDEPENDENT_AMBULATORY_CARE_PROVIDER_SITE_OTHER): Payer: Medicaid Other | Admitting: Certified Nurse Midwife

## 2016-12-03 VITALS — BP 118/59 | HR 101 | Wt 200.0 lb

## 2016-12-03 DIAGNOSIS — Z23 Encounter for immunization: Secondary | ICD-10-CM | POA: Diagnosis not present

## 2016-12-03 DIAGNOSIS — R8299 Other abnormal findings in urine: Secondary | ICD-10-CM

## 2016-12-03 DIAGNOSIS — Z130 Encounter for screening for diseases of the blood and blood-forming organs and certain disorders involving the immune mechanism: Secondary | ICD-10-CM

## 2016-12-03 DIAGNOSIS — R82998 Other abnormal findings in urine: Secondary | ICD-10-CM

## 2016-12-03 DIAGNOSIS — Z131 Encounter for screening for diabetes mellitus: Secondary | ICD-10-CM

## 2016-12-03 DIAGNOSIS — Z3483 Encounter for supervision of other normal pregnancy, third trimester: Secondary | ICD-10-CM

## 2016-12-03 LAB — POCT URINALYSIS DIPSTICK
Bilirubin, UA: NEGATIVE
Glucose, UA: NEGATIVE
KETONES UA: NEGATIVE
Nitrite, UA: NEGATIVE
Protein, UA: NEGATIVE
RBC UA: NEGATIVE
SPEC GRAV UA: 1.015 (ref 1.010–1.025)
UROBILINOGEN UA: 0.2 U/dL
pH, UA: 6.5 (ref 5.0–8.0)

## 2016-12-03 NOTE — Patient Instructions (Addendum)
Tdap Vaccine (Tetanus, Diphtheria and Pertussis): What You Need to Know 1. Why get vaccinated? Tetanus, diphtheria and pertussis are very serious diseases. Tdap vaccine can protect us from these diseases. And, Tdap vaccine given to pregnant women can protect newborn babies against pertussis. TETANUS (Lockjaw) is rare in the [place] today. It causes painful muscle tightening and stiffness, usually all over the body.  It can lead to tightening of muscles in the head and neck so you can't open your mouth, swallow, or sometimes even breathe. Tetanus kills about 1 out of 10 people who are infected even after receiving the best medical care.  DIPHTHERIA is also rare in the [place] today. It can cause a thick coating to form in the back of the throat.  It can lead to breathing problems, heart failure, paralysis, and death.  PERTUSSIS (Whooping Cough) causes severe coughing spells, which can cause difficulty breathing, vomiting and disturbed sleep.  It can also lead to weight loss, incontinence, and rib fractures. Up to 2 in 100 adolescents and 5 in 100 adults with pertussis are hospitalized or have complications, which could include pneumonia or death.  These diseases are caused by bacteria. Diphtheria and pertussis are spread from person to person through secretions from coughing or sneezing. Tetanus enters the body through cuts, scratches, or wounds. Before vaccines, as many as 200,000 cases of diphtheria, 200,000 cases of pertussis, and hundreds of cases of tetanus, were reported in the [place] each year. Since vaccination began, reports of cases for tetanus and diphtheria have dropped by about 99% and for pertussis by about 80%. 2. Tdap vaccine Tdap vaccine can protect adolescents and adults from tetanus, diphtheria, and pertussis. One dose of Tdap is routinely given at age 11 or 12. People who did not get Tdap at that age should get it as soon as possible. Tdap is especially  important for healthcare professionals and anyone having close contact with a baby younger than 12 months. Pregnant women should get a dose of Tdap during every pregnancy, to protect the newborn from pertussis. Infants are most at risk for severe, life-threatening complications from pertussis. Another vaccine, called Td, protects against tetanus and diphtheria, but not pertussis. A Td booster should be given every 10 years. Tdap may be given as one of these boosters if you have never gotten Tdap before. Tdap may also be given after a severe cut or burn to prevent tetanus infection. Your doctor or the person giving you the vaccine can give you more information. Tdap may safely be given at the same time as other vaccines. 3. Some people should not get this vaccine  A person who has ever had a life-threatening allergic reaction after a previous dose of any diphtheria, tetanus or pertussis containing vaccine, OR has a severe allergy to any part of this vaccine, should not get Tdap vaccine. Tell the person giving the vaccine about any severe allergies.  Anyone who had coma or long repeated seizures within 7 days after a childhood dose of DTP or DTaP, or a previous dose of Tdap, should not get Tdap, unless a cause other than the vaccine was found. They can still get Td.  Talk to your doctor if you: ? have seizures or another nervous system problem, ? had severe pain or swelling after any vaccine containing diphtheria, tetanus or pertussis, ? ever had a condition called Guillain-Barr Syndrome (GBS), ? aren't feeling well on the day the shot is scheduled. 4. Risks With any medicine, including   vaccines, there is a chance of side effects. These are usually mild and go away on their own. Serious reactions are also possible but are rare. Most people who get Tdap vaccine do not have any problems with it. Mild problems following Tdap: (Did not interfere with activities)  Pain where the shot was given (about  3 in 4 adolescents or 2 in 3 adults)  Redness or swelling where the shot was given (about 1 person in 5)  Mild fever of at least 100.4F (up to about 1 in 25 adolescents or 1 in 100 adults)  Headache (about 3 or 4 people in 10)  Tiredness (about 1 person in 3 or 4)  Nausea, vomiting, diarrhea, stomach ache (up to 1 in 4 adolescents or 1 in 10 adults)  Chills, sore joints (about 1 person in 10)  Body aches (about 1 person in 3 or 4)  Rash, swollen glands (uncommon)  Moderate problems following Tdap: (Interfered with activities, but did not require medical attention)  Pain where the shot was given (up to 1 in 5 or 6)  Redness or swelling where the shot was given (up to about 1 in 16 adolescents or 1 in 12 adults)  Fever over 102F (about 1 in 100 adolescents or 1 in 250 adults)  Headache (about 1 in 7 adolescents or 1 in 10 adults)  Nausea, vomiting, diarrhea, stomach ache (up to 1 or 3 people in 100)  Swelling of the entire arm where the shot was given (up to about 1 in 500).  Severe problems following Tdap: (Unable to perform usual activities; required medical attention)  Swelling, severe pain, bleeding and redness in the arm where the shot was given (rare).  Problems that could happen after any vaccine:  People sometimes faint after a medical procedure, including vaccination. Sitting or lying down for about 15 minutes can help prevent fainting, and injuries caused by a fall. Tell your doctor if you feel dizzy, or have vision changes or ringing in the ears.  Some people get severe pain in the shoulder and have difficulty moving the arm where a shot was given. This happens very rarely.  Any medication can cause a severe allergic reaction. Such reactions from a vaccine are very rare, estimated at fewer than 1 in a million doses, and would happen within a few minutes to a few hours after the vaccination. As with any medicine, there is a very remote chance of a vaccine  causing a serious injury or death. The safety of vaccines is always being monitored. For more information, visit: www.cdc.gov/vaccinesafety/ 5. What if there is a serious problem? What should I look for? Look for anything that concerns you, such as signs of a severe allergic reaction, very high fever, or unusual behavior. Signs of a severe allergic reaction can include hives, swelling of the face and throat, difficulty breathing, a fast heartbeat, dizziness, and weakness. These would usually start a few minutes to a few hours after the vaccination. What should I do?  If you think it is a severe allergic reaction or other emergency that can't wait, call 9-1-1 or get the person to the nearest hospital. Otherwise, call your doctor.  Afterward, the reaction should be reported to the Vaccine Adverse Event Reporting System (VAERS). Your doctor might file this report, or you can do it yourself through the VAERS web site at www.vaers.hhs.gov, or by calling 1-800-822-7967. ? VAERS does not give medical advice. 6. The National Vaccine Injury Compensation Program The National   Vaccine Injury Compensation Program (VICP) is a federal program that was created to compensate people who may have been injured by certain vaccines. Persons who believe they may have been injured by a vaccine can learn about the program and about filing a claim by calling 1-303-007-6455 or visiting the VICP website at SpiritualWord.at. There is a time limit to file a claim for compensation. 7. How can I learn more?  Ask your doctor. He or she can give you the vaccine package insert or suggest other sources of information.  Call your local or state health department.  Contact the Centers for Disease Control and Prevention (CDC): ? Call 937-426-4457 (1-800-CDC-INFO) or ? Visit CDC's website at PicCapture.uy CDC Tdap Vaccine VIS (06/06/13) This information is not intended to replace advice given to you by your  health care provider. Make sure you discuss any questions you have with your health care provider. Document Released: 09/29/2011 Document Revised: 12/19/2015 Document Reviewed: 12/19/2015 Elsevier Interactive Patient Education  2017 [employer].  Third Trimester of Pregnancy The third trimester is from week 28 through week 40 (months 7 through 9). The third trimester is a time when the unborn baby (fetus) is growing rapidly. At the end of the ninth month, the fetus is about 20 inches in length and weighs 6-10 pounds. Body changes during your third trimester Your body will continue to go through many changes during pregnancy. The changes vary from woman to woman. During the third trimester:  Your weight will continue to increase. You can expect to gain 25-35 pounds (11-16 kg) by the end of the pregnancy.  You may begin to get stretch marks on your hips, abdomen, and breasts.  You may urinate more often because the fetus is moving lower into your pelvis and pressing on your bladder.  You may develop or continue to have heartburn. This is caused by increased hormones that slow down muscles in the digestive tract.  You may develop or continue to have constipation because increased hormones slow digestion and cause the muscles that push waste through your intestines to relax.  You may develop hemorrhoids. These are swollen veins (varicose veins) in the rectum that can itch or be painful.  You may develop swollen, bulging veins (varicose veins) in your legs.  You may have increased body aches in the pelvis, back, or thighs. This is due to weight gain and increased hormones that are relaxing your joints.  You may have changes in your hair. These can include thickening of your hair, rapid growth, and changes in texture. Some women also have hair loss during or after pregnancy, or hair that feels dry or thin. Your hair will most likely return to normal after your baby is born.  Your breasts will  continue to grow and they will continue to become tender. A yellow fluid (colostrum) may leak from your breasts. This is the first milk you are producing for your baby.  Your belly button may stick out.  You may notice more swelling in your hands, face, or ankles.  You may have increased tingling or numbness in your hands, arms, and legs. The skin on your belly may also feel numb.  You may feel short of breath because of your expanding uterus.  You may have more problems sleeping. This can be caused by the size of your belly, increased need to urinate, and an increase in your body's metabolism.  You may notice the fetus "dropping," or moving lower in your abdomen (lightening).  You  may have increased vaginal discharge.  You may notice your joints feel loose and you may have pain around your pelvic bone.  What to expect at prenatal visits You will have prenatal exams every 2 weeks until week 36. Then you will have weekly prenatal exams. During a routine prenatal visit:  You will be weighed to make sure you and the baby are growing normally.  Your blood pressure will be taken.  Your abdomen will be measured to track your baby's growth.  The fetal heartbeat will be listened to.  Any test results from the previous visit will be discussed.  You may have a cervical check near your due date to see if your cervix has softened or thinned (effaced).  You will be tested for Group B streptococcus. This happens between 35 and 37 weeks.  Your health care provider may ask you:  What your birth plan is.  How you are feeling.  If you are feeling the baby move.  If you have had any abnormal symptoms, such as leaking fluid, bleeding, severe headaches, or abdominal cramping.  If you are using any tobacco products, including cigarettes, chewing tobacco, and electronic cigarettes.  If you have any questions.  Other tests or screenings that may be performed during your third trimester  include:  Blood tests that check for low iron levels (anemia).  Fetal testing to check the health, activity level, and growth of the fetus. Testing is done if you have certain medical conditions or if there are problems during the pregnancy.  Nonstress test (NST). This test checks the health of your baby to make sure there are no signs of problems, such as the baby not getting enough oxygen. During this test, a belt is placed around your belly. The baby is made to move, and its heart rate is monitored during movement.  What is false labor? False labor is a condition in which you feel small, irregular tightenings of the muscles in the womb (contractions) that usually go away with rest, changing position, or drinking water. These are called Braxton Hicks contractions. Contractions may last for hours, days, or even weeks before true labor sets in. If contractions come at regular intervals, become more frequent, increase in intensity, or become painful, you should see your health care provider. What are the signs of labor?  Abdominal cramps.  Regular contractions that start at 10 minutes apart and become stronger and more frequent with time.  Contractions that start on the top of the uterus and spread down to the lower abdomen and back.  Increased pelvic pressure and dull back pain.  A watery or bloody mucus discharge that comes from the vagina.  Leaking of amniotic fluid. This is also known as your "water breaking." It could be a slow trickle or a gush. Let your health care provider know if it has a color or strange odor. If you have any of these signs, call your health care provider right away, even if it is before your due date. Follow these instructions at home: Medicines  Follow your health care provider's instructions regarding medicine use. Specific medicines may be either safe or unsafe to take during pregnancy.  Take a prenatal vitamin that contains at least 600 micrograms (mcg) of  folic acid.  If you develop constipation, try taking a stool softener if your health care provider approves. Eating and drinking  Eat a balanced diet that includes fresh fruits and vegetables, whole grains, good sources of protein such as meat, eggs,  or tofu, and low-fat dairy. Your health care provider will help you determine the amount of weight gain that is right for you.  Avoid raw meat and uncooked cheese. These carry germs that can cause birth defects in the baby.  If you have low calcium intake from food, talk to your health care provider about whether you should take a daily calcium supplement.  Eat four or five small meals rather than three large meals a day.  Limit foods that are high in fat and processed sugars, such as fried and sweet foods.  To prevent constipation: ? Drink enough fluid to keep your urine clear or pale yellow. ? Eat foods that are high in fiber, such as fresh fruits and vegetables, whole grains, and beans. Activity  Exercise only as directed by your health care provider. Most women can continue their usual exercise routine during pregnancy. Try to exercise for 30 minutes at least 5 days a week. Stop exercising if you experience uterine contractions.  Avoid heavy lifting.  Do not exercise in extreme heat or humidity, or at high altitudes.  Wear low-heel, comfortable shoes.  Practice good posture.  You may continue to have sex unless your health care provider tells you otherwise. Relieving pain and discomfort  Take frequent breaks and rest with your legs elevated if you have leg cramps or low back pain.  Take warm sitz baths to soothe any pain or discomfort caused by hemorrhoids. Use hemorrhoid cream if your health care provider approves.  Wear a good support bra to prevent discomfort from breast tenderness.  If you develop varicose veins: ? Wear support pantyhose or compression stockings as told by your healthcare provider. ? Elevate your feet for  15 minutes, 3-4 times a day. Prenatal care  Write down your questions. Take them to your prenatal visits.  Keep all your prenatal visits as told by your health care provider. This is important. Safety  Wear your seat belt at all times when driving.  Make a list of emergency phone numbers, including numbers for family, friends, the hospital, and police and fire departments. General instructions  Avoid cat litter boxes and soil used by cats. These carry germs that can cause birth defects in the baby. If you have a cat, ask someone to clean the litter box for you.  Do not travel far distances unless it is absolutely necessary and only with the approval of your health care provider.  Do not use hot tubs, steam rooms, or saunas.  Do not drink alcohol.  Do not use any products that contain nicotine or tobacco, such as cigarettes and e-cigarettes. If you need help quitting, ask your health care provider.  Do not use any medicinal herbs or unprescribed drugs. These chemicals affect the formation and growth of the baby.  Do not douche or use tampons or scented sanitary pads.  Do not cross your legs for long periods of time.  To prepare for the arrival of your baby: ? Take prenatal classes to understand, practice, and ask questions about labor and delivery. ? Make a trial run to the hospital. ? Visit the hospital and tour the maternity area. ? Arrange for maternity or paternity leave through employers. ? Arrange for family and friends to take care of pets while you are in the hospital. ? Purchase a rear-facing car seat and make sure you know how to install it in your car. ? Pack your hospital bag. ? Prepare the baby's nursery. Make sure to remove all  pillows and stuffed animals from the baby's crib to prevent suffocation.  Visit your dentist if you have not gone during your pregnancy. Use a soft toothbrush to brush your teeth and be gentle when you floss. Contact a health care provider  if:  You are unsure if you are in labor or if your water has broken.  You become dizzy.  You have mild pelvic cramps, pelvic pressure, or nagging pain in your abdominal area.  You have lower back pain.  You have persistent nausea, vomiting, or diarrhea.  You have an unusual or bad smelling vaginal discharge.  You have pain when you urinate. Get help right away if:  Your water breaks before 37 weeks.  You have regular contractions less than 5 minutes apart before 37 weeks.  You have a fever.  You are leaking fluid from your vagina.  You have spotting or bleeding from your vagina.  You have severe abdominal pain or cramping.  You have rapid weight loss or weight gain.  You have shortness of breath with chest pain.  You notice sudden or extreme swelling of your face, hands, ankles, feet, or legs.  Your baby makes fewer than 10 movements in 2 hours.  You have severe headaches that do not go away when you take medicine.  You have vision changes. Summary  The third trimester is from week 28 through week 40, months 7 through 9. The third trimester is a time when the unborn baby (fetus) is growing rapidly.  During the third trimester, your discomfort may increase as you and your baby continue to gain weight. You may have abdominal, leg, and back pain, sleeping problems, and an increased need to urinate.  During the third trimester your breasts will keep growing and they will continue to become tender. A yellow fluid (colostrum) may leak from your breasts. This is the first milk you are producing for your baby.  False labor is a condition in which you feel small, irregular tightenings of the muscles in the womb (contractions) that eventually go away. These are called Braxton Hicks contractions. Contractions may last for hours, days, or even weeks before true labor sets in.  Signs of labor can include: abdominal cramps; regular contractions that start at 10 minutes apart and  become stronger and more frequent with time; watery or bloody mucus discharge that comes from the vagina; increased pelvic pressure and dull back pain; and leaking of amniotic fluid. This information is not intended to replace advice given to you by your health care provider. Make sure you discuss any questions you have with your health care provider. Document Released: 03/24/2001 Document Revised: 09/05/2015 Document Reviewed: 05/31/2012 Elsevier Interactive Patient Education  2017 [employer] Vaginal Delivery Vaginal delivery means that you will give birth by pushing your baby out of your birth canal (vagina). A team of health care providers will help you before, during, and after vaginal delivery. Birth experiences are unique for every woman and every pregnancy, and birth experiences vary depending on where you choose to give birth. What should I do to prepare for my baby's birth? Before your baby is born, it is important to talk with your health care provider about:  Your labor and delivery preferences. These may include: ? Medicines that you may be given. ? How you will manage your pain. This might include non-medical pain relief techniques or injectable pain relief such as epidural analgesia. ? How you and your baby will be monitored during labor and delivery. ?  Who may be in the labor and delivery room with you. ? Your feelings about surgical delivery of your baby (cesarean delivery, or C-section) if this becomes necessary. ? Your feelings about receiving donated blood through an IV tube (blood transfusion) if this becomes necessary.  Whether you are able: ? To take pictures or videos of the birth. ? To eat during labor and delivery. ? To move around, walk, or change positions during labor and delivery.  What to expect after your baby is born, such as: ? Whether delayed umbilical cord clamping and cutting is offered. ? Who will care for your baby right after birth. ? Medicines or  tests that may be recommended for your baby. ? Whether breastfeeding is supported in your hospital or birth center. ? How long you will be in the hospital or birth center.  How any medical conditions you have may affect your baby or your labor and delivery experience.  To prepare for your baby's birth, you should also:  Attend all of your health care visits before delivery (prenatal visits) as recommended by your health care provider. This is important.  Prepare your home for your baby's arrival. Make sure that you have: ? Diapers. ? Baby clothing. ? Feeding equipment. ? Safe sleeping arrangements for you and your baby.  Install a car seat in your vehicle. Have your car seat checked by a certified car seat installer to make sure that it is installed safely.  Think about who will help you with your new baby at home for at least the first several weeks after delivery.  What can I expect when I arrive at the birth center or hospital? Once you are in labor and have been admitted into the hospital or birth center, your health care provider may:  Review your pregnancy history and any concerns you have.  Insert an IV tube into one of your veins. This is used to give you fluids and medicines.  Check your blood pressure, pulse, temperature, and heart rate (vital signs).  Check whether your bag of water (amniotic sac) has broken (ruptured).  Talk with you about your birth plan and discuss pain control options.  Monitoring Your health care provider may monitor your contractions (uterine monitoring) and your baby's heart rate (fetal monitoring). You may need to be monitored:  Often, but not continuously (intermittently).  All the time or for long periods at a time (continuously). Continuous monitoring may be needed if: ? You are taking certain medicines, such as medicine to relieve pain or make your contractions stronger. ? You have pregnancy or labor complications.  Monitoring may be  done by:  Placing a special stethoscope or a handheld monitoring device on your abdomen to check your baby's heartbeat, and feeling your abdomen for contractions. This method of monitoring does not continuously record your baby's heartbeat or your contractions.  Placing monitors on your abdomen (external monitors) to record your baby's heartbeat and the frequency and length of contractions. You may not have to wear external monitors all the time.  Placing monitors inside of your uterus (internal monitors) to record your baby's heartbeat and the frequency, length, and strength of your contractions. ? Your health care provider may use internal monitors if he or she needs more information about the strength of your contractions or your baby's heart rate. ? Internal monitors are put in place by passing a thin, flexible wire through your vagina and into your uterus. Depending on the type of monitor, it may remain  in your uterus or on your baby's head until birth. ? Your health care provider will discuss the benefits and risks of internal monitoring with you and will ask for your permission before inserting the monitors.  Telemetry. This is a type of continuous monitoring that can be done with external or internal monitors. Instead of having to stay in bed, you are able to move around during telemetry. Ask your health care provider if telemetry is an option for you.  Physical exam Your health care provider may perform a physical exam. This may include:  Checking whether your baby is positioned: ? With the head toward your vagina (head-down). This is most common. ? With the head toward the top of your uterus (head-up or breech). If your baby is in a breech position, your health care provider may try to turn your baby to a head-down position so you can deliver vaginally. If it does not seem that your baby can be born vaginally, your provider may recommend surgery to deliver your baby. In rare cases, you  may be able to deliver vaginally if your baby is head-up (breech delivery). ? Lying sideways (transverse). Babies that are lying sideways cannot be delivered vaginally.  Checking your cervix to determine: ? Whether it is thinning out (effacing). ? Whether it is opening up (dilating). ? How low your baby has moved into your birth canal.  What are the three stages of labor and delivery?  Normal labor and delivery is divided into the following three stages: Stage 1  Stage 1 is the longest stage of labor, and it can last for hours or days. Stage 1 includes: ? Early labor. This is when contractions may be irregular, or regular and mild. Generally, early labor contractions are more than 10 minutes apart. ? Active labor. This is when contractions get longer, more regular, more frequent, and more intense. ? The transition phase. This is when contractions happen very close together, are very intense, and may last longer than during any other part of labor.  Contractions generally feel mild, infrequent, and irregular at first. They get stronger, more frequent (about every 2-3 minutes), and more regular as you progress from early labor through active labor and transition.  Many women progress through stage 1 naturally, but you may need help to continue making progress. If this happens, your health care provider may talk with you about: ? Rupturing your amniotic sac if it has not ruptured yet. ? Giving you medicine to help make your contractions stronger and more frequent.  Stage 1 ends when your cervix is completely dilated to 4 inches (10 cm) and completely effaced. This happens at the end of the transition phase. Stage 2  Once your cervix is completely effaced and dilated to 4 inches (10 cm), you may start to feel an urge to push. It is common for the body to naturally take a rest before feeling the urge to push, especially if you received an epidural or certain other pain medicines. This rest  period may last for up to 1-2 hours, depending on your unique labor experience.  During stage 2, contractions are generally less painful, because pushing helps relieve contraction pain. Instead of contraction pain, you may feel stretching and burning pain, especially when the widest part of your baby's head passes through the vaginal opening (crowning).  Your health care provider will closely monitor your pushing progress and your baby's progress through the vagina during stage 2.  Your health care provider may massage  the area of skin between your vaginal opening and anus (perineum) or apply warm compresses to your perineum. This helps it stretch as the baby's head starts to crown, which can help prevent perineal tearing. ? In some cases, an incision may be made in your perineum (episiotomy) to allow the baby to pass through the vaginal opening. An episiotomy helps to make the opening of the vagina larger to allow more room for the baby to fit through.  It is very important to breathe and focus so your health care provider can control the delivery of your baby's head. Your health care provider may have you decrease the intensity of your pushing, to help prevent perineal tearing.  After delivery of your baby's head, the shoulders and the rest of the body generally deliver very quickly and without difficulty.  Once your baby is delivered, the umbilical cord may be cut right away, or this may be delayed for 1-2 minutes, depending on your baby's health. This may vary among health care providers, hospitals, and birth centers.  If you and your baby are healthy enough, your baby may be placed on your chest or abdomen to help maintain the baby's temperature and to help you bond with each other. Some mothers and babies start breastfeeding at this time. Your health care team will dry your baby and help keep your baby warm during this time.  Your baby may need immediate care if he or she: ? Showed signs of  distress during labor. ? Has a medical condition. ? Was born too early (prematurely). ? Had a bowel movement before birth (meconium). ? Shows signs of difficulty transitioning from being inside the uterus to being outside of the uterus. If you are planning to breastfeed, your health care team will help you begin a feeding. Stage 3  The third stage of labor starts immediately after the birth of your baby and ends after you deliver the placenta. The placenta is an organ that develops during pregnancy to provide oxygen and nutrients to your baby in the womb.  Delivering the placenta may require some pushing, and you may have mild contractions. Breastfeeding can stimulate contractions to help you deliver the placenta.  After the placenta is delivered, your uterus should tighten (contract) and become firm. This helps to stop bleeding in your uterus. To help your uterus contract and to control bleeding, your health care provider may: ? Give you medicine by injection, through an IV tube, by mouth, or through your rectum (rectally). ? Massage your abdomen or perform a vaginal exam to remove any blood clots that are left in your uterus. ? Empty your bladder by placing a thin, flexible tube (catheter) into your bladder. ? Encourage you to breastfeed your baby. After labor is over, you and your baby will be monitored closely to ensure that you are both healthy until you are ready to go home. Your health care team will teach you how to care for yourself and your baby. This information is not intended to replace advice given to you by your health care provider. Make sure you discuss any questions you have with your health care provider. Document Released: 01/07/2008 Document Revised: 10/18/2015 Document Reviewed: 04/14/2015 Elsevier Interactive Patient Education  2018 [employer].  [facility] 2018 Prenatal Education Class Schedule Register at [email or web address] in the Classes &  Resources Link or call Mardi Mainland at [phone] 9:00a-5:00p M-F  Childbirth Preparation Certified Childbirth Educators teach this 5 week course.  Expectant parents are  encouraged to take this class in their 3rd trimester, completing it by their 35-36th week. Meets in [facility], Lower Level.  Mondays Thursdays  7:00-9:00 p 7:00-9:00 p  July 23 - August 20 July 19 - August 16  September 17 - October 15 September 6 -October 4  November 5 - December 3 October 25 - November 29   No Class on Thanksgiving Day -November 22  Childbirth Preparation Refresher Course For those who have previously attended Prepared Childbirth Preparation classes, this class in incorporated into the 3rd and 4th classes in the Monday night childbirth series.  Course meets in the [facility]. Lower Level from 7:00p - 9:00p  August 6 & 13  October 1 & 8  November 19 & 26   Weekend Childbirth Aundria Mems Classes are held Saturday & Sunday, 1:00 5:00p Course meets in [facility] [place] Level  August 4 & 5  November 3 & 4    The [address] Free tours are held on the third Sunday of each month at 3 pm.  The tour meets in the third floor waiting area and will take approximately 30 minutes.  Tours are also included in Childbirth class series as well as Brother/Sister class.  An online virtual tour can be seen at [email or web address]         Breastfeeding & Infant Nutrition The course incorporates returning to work or school.  Breast milk collection and storage with basic breastfeeding and infant nutrition. This two-class course is held the 2nd and 3rd Tuesday of each month from 7:00 -9:00 pm.  Course meets in the [facility] Lower level  June 12 & 19 July-No Class  August 14 & 21 September 11 &18  September 11 & 18 October 9 &16  November 13 & 20 December 11 & 18   [employer] welcomes any mother for a social outing with other Moms to share  experiences and challenges in an informal setting.  Meets the 1st Thursday and 3rd Thursday 11:30a-1:00 pm of each month in [facility] 3rd floor classroom.  No registration required.  Newborn Essentials This course covers bathing, diapering, swaddling and more with practice on lifelike dolls.  Participants will also learn safety tips and infant CPR (Not for certification).  It is held the 1st Wednesday of each month from 7:00p-9:00p in the [facility], Lower level.  June 6 July- No Class  August 1 September 5  October 3 November 7  December 7    Preparing Big Brother & Sister This one session course prepares children and their parents for the arrival of a new baby.  It is held on the 1st Tuesday of each month from 6:30p - 8:00p. Course meets in the [facility], Lower level.  July-No Class August 7  September 4 October 2  November 6 December 4   [place] for [employer]  Dads This nationally acclaimed class helps expecting and new dads with the basic skills and confidence to bond with their infants, support their mates, and provide a safe and healthy home environment for their new family. Classes are held the 2nd Saturday of every month from 9:00a - 12:00 noon.  Course meets in the [facility] Lower level.  June 9 August 11  October 13 No Class in December   Breastfeeding Deciding to breastfeed is one of the best choices you can make for you and your baby. A change in hormones during pregnancy causes your breast tissue  to grow and increases the number and size of your milk ducts. These hormones also allow proteins, sugars, and fats from your blood supply to make breast milk in your milk-producing glands. Hormones prevent breast milk from being released before your baby is born as well as prompt milk flow after birth. Once breastfeeding has begun, thoughts of your baby, as well as his or her sucking or crying, can stimulate the release of milk from your milk-producing  glands. Benefits of breastfeeding For Your Baby  Your first milk (colostrum) helps your baby's digestive system function better.  There are antibodies in your milk that help your baby fight off infections.  Your baby has a lower incidence of asthma, allergies, and sudden infant death syndrome.  The nutrients in breast milk are better for your baby than infant formulas and are designed uniquely for your baby's needs.  Breast milk improves your baby's brain development.  Your baby is less likely to develop other conditions, such as childhood obesity, asthma, or type 2 diabetes mellitus.  For You  Breastfeeding helps to create a very special bond between you and your baby.  Breastfeeding is convenient. Breast milk is always available at the correct temperature and costs nothing.  Breastfeeding helps to burn calories and helps you lose the weight gained during pregnancy.  Breastfeeding makes your uterus contract to its prepregnancy size faster and slows bleeding (lochia) after you give birth.  Breastfeeding helps to lower your risk of developing type 2 diabetes mellitus, osteoporosis, and breast or ovarian cancer later in life.  Signs that your baby is hungry Early Signs of Hunger  Increased alertness or activity.  Stretching.  Movement of the head from side to side.  Movement of the head and opening of the mouth when the corner of the mouth or cheek is stroked (rooting).  Increased sucking sounds, smacking lips, cooing, sighing, or squeaking.  Hand-to-mouth movements.  Increased sucking of fingers or hands.  Late Signs of Hunger  Fussing.  Intermittent crying.  Extreme Signs of Hunger Signs of extreme hunger will require calming and consoling before your baby will be able to breastfeed successfully. Do not wait for the following signs of extreme hunger to occur before you initiate breastfeeding:  Restlessness.  A loud, strong cry.  Screaming.  Breastfeeding  basics Breastfeeding Initiation  Find a comfortable place to sit or lie down, with your neck and back well supported.  Place a pillow or rolled up blanket under your baby to bring him or her to the level of your breast (if you are seated). Nursing pillows are specially designed to help support your arms and your baby while you breastfeed.  Make sure that your baby's abdomen is facing your abdomen.  Gently massage your breast. With your fingertips, massage from your chest wall toward your nipple in a circular motion. This encourages milk flow. You may need to continue this action during the feeding if your milk flows slowly.  Support your breast with 4 fingers underneath and your thumb above your nipple. Make sure your fingers are well away from your nipple and your baby's mouth.  Stroke your baby's lips gently with your finger or nipple.  When your baby's mouth is open wide enough, quickly bring your baby to your breast, placing your entire nipple and as much of the colored area around your nipple (areola) as possible into your baby's mouth. ? More areola should be visible above your baby's upper lip than below the lower lip. ?  Your baby's tongue should be between his or her lower gum and your breast.  Ensure that your baby's mouth is correctly positioned around your nipple (latched). Your baby's lips should create a seal on your breast and be turned out (everted).  It is common for your baby to suck about 2-3 minutes in order to start the flow of breast milk.  Latching Teaching your baby how to latch on to your breast properly is very important. An improper latch can cause nipple pain and decreased milk supply for you and poor weight gain in your baby. Also, if your baby is not latched onto your nipple properly, he or she may swallow some air during feeding. This can make your baby fussy. Burping your baby when you switch breasts during the feeding can help to get rid of the air. However,  teaching your baby to latch on properly is still the best way to prevent fussiness from swallowing air while breastfeeding. Signs that your baby has successfully latched on to your nipple:  Silent tugging or silent sucking, without causing you pain.  Swallowing heard between every 3-4 sucks.  Muscle movement above and in front of his or her ears while sucking.  Signs that your baby has not successfully latched on to nipple:  Sucking sounds or smacking sounds from your baby while breastfeeding.  Nipple pain.  If you think your baby has not latched on correctly, slip your finger into the corner of your baby's mouth to break the suction and place it between your baby's gums. Attempt breastfeeding initiation again. Signs of Successful Breastfeeding Signs from your baby:  A gradual decrease in the number of sucks or complete cessation of sucking.  Falling asleep.  Relaxation of his or her body.  Retention of a small amount of milk in his or her mouth.  Letting go of your breast by himself or herself.  Signs from you:  Breasts that have increased in firmness, weight, and size 1-3 hours after feeding.  Breasts that are softer immediately after breastfeeding.  Increased milk volume, as well as a change in milk consistency and color by the fifth day of breastfeeding.  Nipples that are not sore, cracked, or bleeding.  Signs That Your Pecola Leisure is Getting Enough Milk  Wetting at least 1-2 diapers during the first 24 hours after birth.  Wetting at least 5-6 diapers every 24 hours for the first week after birth. The urine should be clear or pale yellow by 5 days after birth.  Wetting 6-8 diapers every 24 hours as your baby continues to grow and develop.  At least 3 stools in a 24-hour period by age 97 days. The stool should be soft and yellow.  At least 3 stools in a 24-hour period by age 1 days. The stool should be seedy and yellow.  No loss of weight greater than 10% of birth weight  during the first 39 days of age.  Average weight gain of 4-7 ounces (113-198 g) per week after age 96 days.  Consistent daily weight gain by age 97 days, without weight loss after the age of 2 weeks.  After a feeding, your baby may spit up a small amount. This is common. Breastfeeding frequency and duration Frequent feeding will help you make more milk and can prevent sore nipples and breast engorgement. Breastfeed when you feel the need to reduce the fullness of your breasts or when your baby shows signs of hunger. This is called "breastfeeding on demand." Avoid introducing  a pacifier to your baby while you are working to establish breastfeeding (the first 4-6 weeks after your baby is born). After this time you may choose to use a pacifier. Research has shown that pacifier use during the first year of a baby's life decreases the risk of sudden infant death syndrome (SIDS). Allow your baby to feed on each breast as long as he or she wants. Breastfeed until your baby is finished feeding. When your baby unlatches or falls asleep while feeding from the first breast, offer the second breast. Because newborns are often sleepy in the first few weeks of life, you may need to awaken your baby to get him or her to feed. Breastfeeding times will vary from baby to baby. However, the following rules can serve as a guide to help you ensure that your baby is properly fed:  Newborns (babies 58 weeks of age or younger) may breastfeed every 1-3 hours.  Newborns should not go longer than 3 hours during the day or 5 hours during the night without breastfeeding.  You should breastfeed your baby a minimum of 8 times in a 24-hour period until you begin to introduce solid foods to your baby at around 14 months of age.  Breast milk pumping Pumping and storing breast milk allows you to ensure that your baby is exclusively fed your breast milk, even at times when you are unable to breastfeed. This is especially important if you  are going back to work while you are still breastfeeding or when you are not able to be present during feedings. Your lactation consultant can give you guidelines on how long it is safe to store breast milk. A breast pump is a machine that allows you to pump milk from your breast into a sterile bottle. The pumped breast milk can then be stored in a refrigerator or freezer. Some breast pumps are operated by hand, while others use electricity. Ask your lactation consultant which type will work best for you. Breast pumps can be purchased, but some hospitals and breastfeeding support groups lease breast pumps on a monthly basis. A lactation consultant can teach you how to hand express breast milk, if you prefer not to use a pump. Caring for your breasts while you breastfeed Nipples can become dry, cracked, and sore while breastfeeding. The following recommendations can help keep your breasts moisturized and healthy:  Avoid using soap on your nipples.  Wear a supportive bra. Although not required, special nursing bras and tank tops are designed to allow access to your breasts for breastfeeding without taking off your entire bra or top. Avoid wearing underwire-style bras or extremely tight bras.  Air dry your nipples for 3-53minutes after each feeding.  Use only cotton bra pads to absorb leaked breast milk. Leaking of breast milk between feedings is normal.  Use lanolin on your nipples after breastfeeding. Lanolin helps to maintain your skin's normal moisture barrier. If you use pure lanolin, you do not need to wash it off before feeding your baby again. Pure lanolin is not toxic to your baby. You may also hand express a few drops of breast milk and gently massage that milk into your nipples and allow the milk to air dry.  In the first few weeks after giving birth, some women experience extremely full breasts (engorgement). Engorgement can make your breasts feel heavy, warm, and tender to the touch.  Engorgement peaks within 3-5 days after you give birth. The following recommendations can help ease engorgement:  Completely  empty your breasts while breastfeeding or pumping. You may want to start by applying warm, moist heat (in the shower or with warm water-soaked hand towels) just before feeding or pumping. This increases circulation and helps the milk flow. If your baby does not completely empty your breasts while breastfeeding, pump any extra milk after he or she is finished.  Wear a snug bra (nursing or regular) or tank top for 1-2 days to signal your body to slightly decrease milk production.  Apply ice packs to your breasts, unless this is too uncomfortable for you.  Make sure that your baby is latched on and positioned properly while breastfeeding.  If engorgement persists after 48 hours of following these recommendations, contact your health care provider or a Advertising copywriter. Overall health care recommendations while breastfeeding  Eat healthy foods. Alternate between meals and snacks, eating 3 of each per day. Because what you eat affects your breast milk, some of the foods may make your baby more irritable than usual. Avoid eating these foods if you are sure that they are negatively affecting your baby.  Drink milk, fruit juice, and water to satisfy your thirst (about 10 glasses a day).  Rest often, relax, and continue to take your prenatal vitamins to prevent fatigue, stress, and anemia.  Continue breast self-awareness checks.  Avoid chewing and smoking tobacco. Chemicals from cigarettes that pass into breast milk and exposure to secondhand smoke may harm your baby.  Avoid alcohol and drug use, including marijuana. Some medicines that may be harmful to your baby can pass through breast milk. It is important to ask your health care provider before taking any medicine, including all over-the-counter and prescription medicine as well as vitamin and herbal supplements. It is  possible to become pregnant while breastfeeding. If birth control is desired, ask your health care provider about options that will be safe for your baby. Contact a health care provider if:  You feel like you want to stop breastfeeding or have become frustrated with breastfeeding.  You have painful breasts or nipples.  Your nipples are cracked or bleeding.  Your breasts are red, tender, or warm.  You have a swollen area on either breast.  You have a fever or chills.  You have nausea or vomiting.  You have drainage other than breast milk from your nipples.  Your breasts do not become full before feedings by the fifth day after you give birth.  You feel sad and depressed.  Your baby is too sleepy to eat well.  Your baby is having trouble sleeping.  Your baby is wetting less than 3 diapers in a 24-hour period.  Your baby has less than 3 stools in a 24-hour period.  Your baby's skin or the white part of his or her eyes becomes yellow.  Your baby is not gaining weight by 23 days of age. Get help right away if:  Your baby is overly tired (lethargic) and does not want to wake up and feed.  Your baby develops an unexplained fever. This information is not intended to replace advice given to you by your health care provider. Make sure you discuss any questions you have with your health care provider. Document Released: 03/30/2005 Document Revised: 09/11/2015 Document Reviewed: 09/21/2012 Elsevier Interactive Patient Education  2017 [employer]  Cord Blood Banking Information Cord blood banking is the process of collecting and storing the blood that is in the umbilical cord and placenta at the time of delivery. This blood contains stem cells,  which can be used to treat many blood diseases, immune system disorders, and childhood cancers. Stem cells can also be used to research certain diseases and treatments. Many people who choose cord blood banking donate the blood. Donated blood  can be used in lifesaving treatments or for research. Other people choose to store the blood privately. Blood that is stored privately can only be used with the person's permission. This option is often chosen if:  A family member needs a stem cell transplant.  The child is part of an ethnic minority.  The child was conceived through in vitro fertilization.  What should I look for in a blood bank? A blood bank is the organization that coordinates cord blood banking. Make sure the cord blood bank that you use:  Is accredited.  Is financially stable.  Handles a large volume of cord blood samples.  Has a procedure in place for transport and storage.  Allows you the option of transferring your cord blood sample.  Has a procedure in place if the bank goes out of business.  Clearly states all costs and limits to future costs.  People who choose to donate cord blood should not need to pay for blood banking. People who keep the blood for private use will need to pay for the first (initial) storage and pay a fee each year (annual fee). Other fees may also apply. What are the risks of cord blood banking? There are no health risks associated with cord blood banking. It is considered safe. How should I prepare? You must schedule this process at least 4-6 weeks before you will be giving birth. How is the blood collected? The blood is collected as soon as the baby has been delivered. Within 15 minutes of delivery, a health care provider will take these actions to collect the blood:  Clamp the umbilical cord at the top and bottom. This traps the blood in the umbilical cord.  Use a syringe or bag to collect the blood.  Insert needles into the placenta to collect (draw out) more blood.  What happens after the blood is collected? After the blood has been collected:  The blood will be sent to a blood bank.  The blood will be tested for genetic problems and infectious diseases. If the blood  tests positive for a genetic problem or a disease, someone will contact you and let you know.  The blood will be frozen.  If your child develops a genetic condition, immune system disorder, or cancer, you will be responsible for contacting the blood bank and letting them know. This information is not intended to replace advice given to you by your health care provider. Make sure you discuss any questions you have with your health care provider. Document Released: 09/17/2009 Document Revised: 09/05/2015 Document Reviewed: 09/17/2014 Elsevier Interactive Patient Education  2018 [employer].

## 2016-12-04 LAB — GLUCOSE TOLERANCE, 1 HOUR: GLUCOSE, 1HR PP: 141 mg/dL (ref 65–199)

## 2016-12-04 LAB — CBC
Hematocrit: 33.2 % — ABNORMAL LOW (ref 34.0–46.6)
Hemoglobin: 11.7 g/dL (ref 11.1–15.9)
MCH: 33.1 pg — ABNORMAL HIGH (ref 26.6–33.0)
MCHC: 35.2 g/dL (ref 31.5–35.7)
MCV: 94 fL (ref 79–97)
PLATELETS: 290 10*3/uL (ref 150–379)
RBC: 3.53 x10E6/uL — AB (ref 3.77–5.28)
RDW: 13.6 % (ref 12.3–15.4)
WBC: 13.3 10*3/uL — ABNORMAL HIGH (ref 3.4–10.8)

## 2016-12-05 LAB — CULTURE, OB URINE

## 2016-12-05 LAB — URINE CULTURE, OB REFLEX

## 2016-12-06 NOTE — Progress Notes (Signed)
ROB-Reports increased reflux. Advised use of Zantac and Tums. Discussed intrapartum pain management options, childbirth classes, cord blood banking, and postpartum pregnancy prevention options. TDaP given. Blood consent reviewed and signed. Glucola and CBC today. Reviewed red flag symptoms and when to call. RTC x 2 week for ROB or sooner if needed.

## 2016-12-17 ENCOUNTER — Other Ambulatory Visit: Payer: Medicaid Other

## 2016-12-17 ENCOUNTER — Encounter: Payer: Self-pay | Admitting: Certified Nurse Midwife

## 2016-12-17 ENCOUNTER — Ambulatory Visit (INDEPENDENT_AMBULATORY_CARE_PROVIDER_SITE_OTHER): Payer: Medicaid Other | Admitting: Certified Nurse Midwife

## 2016-12-17 VITALS — BP 116/78 | HR 93 | Wt 199.9 lb

## 2016-12-17 DIAGNOSIS — Z3483 Encounter for supervision of other normal pregnancy, third trimester: Secondary | ICD-10-CM

## 2016-12-17 DIAGNOSIS — R7309 Other abnormal glucose: Secondary | ICD-10-CM

## 2016-12-17 LAB — POCT URINALYSIS DIPSTICK
BILIRUBIN UA: NEGATIVE
KETONES UA: NEGATIVE
LEUKOCYTES UA: NEGATIVE
Nitrite, UA: NEGATIVE
PH UA: 6.5 (ref 5.0–8.0)
Protein, UA: NEGATIVE
RBC UA: NEGATIVE
SPEC GRAV UA: 1.02 (ref 1.010–1.025)
Urobilinogen, UA: 0.2 E.U./dL

## 2016-12-17 NOTE — Patient Instructions (Signed)

## 2016-12-18 ENCOUNTER — Other Ambulatory Visit: Payer: Self-pay | Admitting: Certified Nurse Midwife

## 2016-12-18 ENCOUNTER — Encounter: Payer: Medicaid Other | Admitting: Certified Nurse Midwife

## 2016-12-18 DIAGNOSIS — O24419 Gestational diabetes mellitus in pregnancy, unspecified control: Secondary | ICD-10-CM | POA: Insufficient documentation

## 2016-12-18 LAB — GESTATIONAL GLUCOSE TOLERANCE
GLUCOSE 1 HOUR GTT: 201 mg/dL — AB (ref 65–179)
GLUCOSE 2 HOUR GTT: 172 mg/dL — AB (ref 65–154)
Glucose, Fasting: 77 mg/dL (ref 65–94)
Glucose, GTT - 3 Hour: 156 mg/dL — ABNORMAL HIGH (ref 65–139)

## 2016-12-20 NOTE — Progress Notes (Signed)
ROB-Pt doing well, report lower back pain. Discussed home treatment measures including use of abdominal support. Three (3) hr GTT today, will notify pt via MyChart with results. Reviewed red flag symptoms and when to call. RTC x 2 weeks for ROB or sooner if needed.

## 2016-12-30 ENCOUNTER — Encounter: Payer: Medicaid Other | Admitting: Certified Nurse Midwife

## 2017-01-01 ENCOUNTER — Encounter: Payer: Self-pay | Admitting: Certified Nurse Midwife

## 2017-01-01 ENCOUNTER — Ambulatory Visit (INDEPENDENT_AMBULATORY_CARE_PROVIDER_SITE_OTHER): Payer: Medicaid Other | Admitting: Certified Nurse Midwife

## 2017-01-01 VITALS — BP 117/75 | HR 118 | Wt 201.6 lb

## 2017-01-01 DIAGNOSIS — Z3493 Encounter for supervision of normal pregnancy, unspecified, third trimester: Secondary | ICD-10-CM

## 2017-01-01 DIAGNOSIS — O24419 Gestational diabetes mellitus in pregnancy, unspecified control: Secondary | ICD-10-CM

## 2017-01-01 NOTE — Progress Notes (Signed)
ROB- pt is doing well, denies any complaints 

## 2017-01-01 NOTE — Patient Instructions (Signed)

## 2017-01-01 NOTE — Progress Notes (Signed)
ROB, doing well. Discussed diabetic teaching. Pt states that they have called her 2 x but she has not been able to schedule appointment. Reviewed importance of diabetic education and testing of blood sugar. She verbalizes understanding. She states that she does not think she will be able to stick herself and follow BS log as needed. 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 She verbalizes understanding. Fetal Heart rate elevated today due to increased movement at time of appointment. Fetal movement visualized during exam. ROB in 2 wks with growth and AFI early due to not managing diabetes appropriatly.   Doreene Burke, CNM

## 2017-01-05 ENCOUNTER — Other Ambulatory Visit: Payer: Self-pay | Admitting: Certified Nurse Midwife

## 2017-01-05 DIAGNOSIS — O24410 Gestational diabetes mellitus in pregnancy, diet controlled: Secondary | ICD-10-CM

## 2017-01-08 ENCOUNTER — Encounter: Payer: Medicaid Other | Attending: Certified Nurse Midwife | Admitting: *Deleted

## 2017-01-08 ENCOUNTER — Telehealth: Payer: Self-pay | Admitting: Certified Nurse Midwife

## 2017-01-08 ENCOUNTER — Encounter: Payer: Self-pay | Admitting: *Deleted

## 2017-01-08 VITALS — BP 110/76 | Ht 64.0 in | Wt 199.1 lb

## 2017-01-08 DIAGNOSIS — O24419 Gestational diabetes mellitus in pregnancy, unspecified control: Secondary | ICD-10-CM | POA: Diagnosis not present

## 2017-01-08 DIAGNOSIS — O24410 Gestational diabetes mellitus in pregnancy, diet controlled: Secondary | ICD-10-CM

## 2017-01-08 DIAGNOSIS — Z3A00 Weeks of gestation of pregnancy not specified: Secondary | ICD-10-CM | POA: Diagnosis not present

## 2017-01-08 DIAGNOSIS — Z713 Dietary counseling and surveillance: Secondary | ICD-10-CM | POA: Diagnosis present

## 2017-01-08 NOTE — Telephone Encounter (Signed)
Patient called and stated that she would like to speak to a nurse in regards to a medication that she needs. No other information was disclosed.

## 2017-01-08 NOTE — Progress Notes (Signed)
Diabetes Self-Management Education  Visit Type: First/Initial  Appt. Start Time: 0815 Appt. End Time: 0940  01/08/2017  [name], identified by name and date of birth, is a 26 y.o. female with a diagnosis of Diabetes: Gestational Diabetes.   ASSESSMENT  Blood pressure 110/76, height  (1.626 m), weight 199 lb 1.6 oz (90.3 kg), last menstrual period 05/12/2016. Body mass index is 34.18 kg/m.      Diabetes Self-Management Education - 01/08/17 1011      Visit Information   Visit Type First/Initial     Initial Visit   Diabetes Type Gestational Diabetes   Are you currently following a meal plan? Yes   What type of meal plan do you follow? "not as much soft drinks, sugary foods; switched to diet drinks; eat more grilled chicken"   Are you taking your medications as prescribed? Yes   Date Diagnosed Sept 2018     Health Coping   How would you rate your overall health? Excellent     Psychosocial Assessment   Patient Belief/Attitude about Diabetes Motivated to manage diabetes  "nervous"   Self-care barriers None   Self-management support Doctor's office   Patient Concerns Nutrition/Meal planning;Glycemic Control;Monitoring;Healthy Lifestyle   Special Needs None   Preferred Learning Style Hands on   Learning Readiness Change in progress   How often do you need to have someone help you when you read instructions, pamphlets, or other written materials from your doctor or pharmacy? 1 - Never   What is the last grade level you completed in school? high school diploma     Pre-Education Assessment   Patient understands the diabetes disease and treatment process. Needs Instruction   Patient understands incorporating nutritional management into lifestyle. Needs Instruction   Patient undertands incorporating physical activity into lifestyle. Needs Instruction   Patient understands using medications safely. Needs Instruction   Patient understands monitoring blood glucose,  interpreting and using results Needs Instruction   Patient understands prevention, detection, and treatment of acute complications. Needs Instruction   Patient understands prevention, detection, and treatment of chronic complications. Needs Instruction   Patient understands how to develop strategies to address psychosocial issues. Needs Instruction   Patient understands how to develop strategies to promote health/change behavior. Needs Instruction     Complications   How often do you check your blood sugar? 0 times/day (not testing)  Provided Accu-Chek Guide meter and instructed on use. BG upon return demonstration was 85 mg/dL at 1:61 am - fasting.    Have you had a dilated eye exam in the past 12 months? No   Have you had a dental exam in the past 12 months? Yes   Are you checking your feet? Yes   How many days per week are you checking your feet? 7     Dietary Intake   Breakfast protein bar or sausage or egg McMuffin   Snack (morning) fruit   Lunch eats out for most lunches - [employer] grilled chicken wrap or cheeseburger - no fries   Snack (afternoon) fruit or protein bar   Dinner grilled chicken, steak, pork with green beans, potatoes, corn - no bread   Snack (evening) fruit or graham crackers   Beverage(s) water, 1 diet soda per day     Exercise   Exercise Type ADL's     Patient Education   Previous Diabetes Education No   Disease state  Definition of diabetes, type 1 and 2, and the diagnosis of diabetes  Nutrition management  Role of diet in the treatment of diabetes and the relationship between the three main macronutrients and blood glucose level;Reviewed blood glucose goals for pre and post meals and how to evaluate the patients' food intake on their blood glucose level.   Physical activity and exercise  Role of exercise on diabetes management, blood pressure control and cardiac health.   Monitoring Taught/evaluated SMBG meter.;Purpose and frequency of SMBG.;Taught/discussed  recording of test results and interpretation of SMBG.;Ketone testing, when, how.   Chronic complications Relationship between chronic complications and blood glucose control   Psychosocial adjustment Identified and addressed patients feelings and concerns about diabetes   Preconception care Pregnancy and GDM  Role of pre-pregnancy blood glucose control on the development of the fetus;Reviewed with patient blood glucose goals with pregnancy;Role of family planning for patients with diabetes   Personal strategies to promote health Review risk of smoking and offered smoking cessation     Individualized Goals (developed by patient)   Reducing Risk Improve blood sugars Prevent diabetes complications Lose weight Lead a healthier lifestyle Quit smoking     Outcomes   Expected Outcomes Demonstrated interest in learning. Expect positive outcomes      Individualized Plan for Diabetes Self-Management Training:   Learning Objective:  Patient will have a greater understanding of diabetes self-management. Patient education plan is to attend individual and/or group sessions per assessed needs and concerns.   Plan:   Patient Instructions  Read booklet on Gestational Diabetes Follow Gestational Meal Planning Guidelines Limit foods high in fat Complete a 3 Day Food Record and bring to next appointment Check blood sugars 4 x day - before breakfast and 2 hrs after every meal and record  Bring blood sugar log to all appointments Call MD for prescription for meter strips and lancets Strips  Accu-Chek Guide Lancets   Accu-Chek FastClix Purchase urine ketone strips if blood sugars not controlled and check urine ketones every am:  If + increase bedtime snack to 1 protein and 2 carbohydrate servings Walk 20-30 minutes at least 5 x week if permitted by MD Quit smoking  Expected Outcomes:  Demonstrated interest in learning. Expect positive outcomes  Education material provided:  Gestational  Booklet Gestational Meal Planning Guidelines Simple Meal Plan Viewed Gestational Diabetes Video Meter = Accu-Chek Guide 3 Day Food Record Goals for a Healthy Pregnancy  If problems or questions, patient to contact team via:  Sharion Settler, RN, CCM, CDE [phone]  Future DSME appointment:  January 14, 2017 with the dietitian

## 2017-01-08 NOTE — Patient Instructions (Signed)
Read booklet on Gestational Diabetes Follow Gestational Meal Planning Guidelines Limit foods high in fat Complete a 3 Day Food Record and bring to next appointment Check blood sugars 4 x day - before breakfast and 2 hrs after every meal and record  Bring blood sugar log to all appointments Call MD for prescription for meter strips and lancets Strips  Accu-Chek Guide Lancets   Accu-Chek FastClix Purchase urine ketone strips if blood sugars not controlled and check urine ketones every am:  If + increase bedtime snack to 1 protein and 2 carbohydrate servings Walk 20-30 minutes at least 5 x week if permitted by MD Quit smoking

## 2017-01-11 MED ORDER — GLUCOSE BLOOD VI STRP: ORAL_STRIP | 12 refills | Status: DC

## 2017-01-11 MED ORDER — ACCU-CHEK FASTCLIX LANCETS MISC: 1.0000 | Freq: Four times a day (QID) | 12 refills | Status: DC

## 2017-01-11 NOTE — Telephone Encounter (Signed)
Pt returned my call- she needs Accucheck test strips and Fast click lancets sent to [facility] on [address].

## 2017-01-13 ENCOUNTER — Ambulatory Visit (INDEPENDENT_AMBULATORY_CARE_PROVIDER_SITE_OTHER): Payer: Medicaid Other | Admitting: Certified Nurse Midwife

## 2017-01-13 ENCOUNTER — Ambulatory Visit (INDEPENDENT_AMBULATORY_CARE_PROVIDER_SITE_OTHER): Payer: Medicaid Other

## 2017-01-13 ENCOUNTER — Encounter: Payer: Self-pay | Admitting: Certified Nurse Midwife

## 2017-01-13 VITALS — BP 121/87 | HR 83 | Wt 200.8 lb

## 2017-01-13 DIAGNOSIS — Z3493 Encounter for supervision of normal pregnancy, unspecified, third trimester: Secondary | ICD-10-CM

## 2017-01-13 DIAGNOSIS — Z23 Encounter for immunization: Secondary | ICD-10-CM

## 2017-01-13 DIAGNOSIS — Z369 Encounter for antenatal screening, unspecified: Secondary | ICD-10-CM

## 2017-01-13 DIAGNOSIS — O24410 Gestational diabetes mellitus in pregnancy, diet controlled: Secondary | ICD-10-CM | POA: Diagnosis not present

## 2017-01-13 DIAGNOSIS — Z113 Encounter for screening for infections with a predominantly sexual mode of transmission: Secondary | ICD-10-CM

## 2017-01-13 LAB — POCT URINALYSIS DIPSTICK
Bilirubin, UA: NEGATIVE
GLUCOSE UA: NEGATIVE
Ketones, UA: NEGATIVE
NITRITE UA: NEGATIVE
PROTEIN UA: NEGATIVE
RBC UA: NEGATIVE
SPEC GRAV UA: 1.01 (ref 1.010–1.025)
UROBILINOGEN UA: 0.2 U/dL
pH, UA: 6 (ref 5.0–8.0)

## 2017-01-13 NOTE — Progress Notes (Signed)
  ROB, doing wel. U/s today for growth /AFI gor GDM. EFW: 2959 grams ( 6 lbs. 8 oz.) 51st percentile for growth, Williams.4. AFI: adequate for gestational age at 18.6 cm. GBS and cultures today. SVE per pt request. Unable to reach cervix ( pt to uncomfortable with exam). Blood sugar log reviewed. Fastings: 80-90, 2 hr PP 82-128. With one elevated at 156 . Follow up 1 wk for NST and ROB.   Doreene Burke, CNM ULTRASOUND REPORT  Location: [facility] Date of Service: 01/13/17  Indications:  EFW and AFI for GDM Findings:  Singleton intrauterine pregnancy is visualized with FHR at 162 BPM. Biometrics give an (U/S) Gestational age of [redacted] weeks 6 days, and an (U/S) EDD of 02/04/17; this correlates with the clinically established EDD of 02/08/17.  Fetal presentation is vertex, spine left lateral.  EFW: 2959 grams ( 6 lbs. 8 oz. ) 51st percentile for growth, Williams Placenta: Right lateral, grade 3 with calcifications seen. AFI: Adequate for gestational age at 18.6 cm.  Anatomic survey of the fetal lateral ventricle, kidneys, stomach and bladder are visualized. There is slightly dilation of the left renal pelvis at 7.3 mm (> 7.0 mm considered abnormal.) Gender - Female.    Impression: 1. 36 week 6 day Viable Singleton Intrauterine pregnancy by U/S. 2. (U/S) EDD is consistent with Clinically established (LMP) EDD of 02/08/17. 3. EFW: 2959 grams ( 6 lbs. 8 oz.) 51st percentile for growth, Williams. 4. AFI: adequate for gestational age at 18.6 cm. 5. Dilated left renal pelvis at 7.3 mm.  Recommendations: 1.Clinical correlation with the patient's History and Physical Exam.  Revonda Humphrey, RDMS, RVT

## 2017-01-13 NOTE — Patient Instructions (Signed)

## 2017-01-14 ENCOUNTER — Encounter: Payer: Medicaid Other | Attending: Certified Nurse Midwife | Admitting: Dietician

## 2017-01-14 ENCOUNTER — Encounter: Payer: Self-pay | Admitting: Dietician

## 2017-01-14 VITALS — BP 118/80 | Ht 64.0 in | Wt 202.2 lb

## 2017-01-14 DIAGNOSIS — O24419 Gestational diabetes mellitus in pregnancy, unspecified control: Secondary | ICD-10-CM | POA: Diagnosis not present

## 2017-01-14 DIAGNOSIS — Z713 Dietary counseling and surveillance: Secondary | ICD-10-CM | POA: Diagnosis not present

## 2017-01-14 DIAGNOSIS — Z3A00 Weeks of gestation of pregnancy not specified: Secondary | ICD-10-CM | POA: Insufficient documentation

## 2017-01-14 DIAGNOSIS — O24410 Gestational diabetes mellitus in pregnancy, diet controlled: Secondary | ICD-10-CM

## 2017-01-14 NOTE — Patient Instructions (Signed)
Patient to include equivalent of 2 servings of carbohydrate at breakfast (30gms) and a protein food. No fruit at breakfast. To include a morning snack consisting of 1 serving of carbohydrate and a protein. To include 2-4 servings of carbohydrate at lunch and dinner. Include an afternoon snack  consisting of 1 serving of carbohydrate and protein. Read labels for carbohydrate gms. Continue to check blood sugars before and after breakfast and 2 hours after lunch and dinner.

## 2017-01-14 NOTE — Progress Notes (Signed)
Diabetes Self-Management Education  Visit Type:  Follow-up  Appt. Start Time: 8:15  Appt. End Time: 9:15  01/14/2017  Sandy Jenkins, identified by name and date of birth, is a 26 y.o. female with a diagnosis of Diabetes: Gestational diabetes  ASSESSMENT  Blood pressure 118/80, height  (1.626 m), weight 202 lb 3.2 oz (91.7 kg), last menstrual period 05/12/2016. Body mass index is 34.71 kg/m.       Diabetes Self-Management Education - 01/14/17 1018      Complications   How often do you check your blood sugar? 3-4 times/day   Fasting Blood glucose range (mg/dL) 09-811   Postprandial Blood glucose range (mg/dL) 91-478  had one reading at 156 after a high carbohydrate meal   Have you had a dilated eye exam in the past 12 months? No   Have you had a dental exam in the past 12 months? Yes   Are you checking your feet? No     Dietary Intake   Breakfast Nutrigrain bar (24 gms carbohydrate) [facility] granola bar (20 gms carbohydrate), water   Snack (morning) fruit or another breakfast bar   Lunch 12:00- chicken wrap with lettuce, cheese or soft taco, water   Snack (afternoon) 3:00pm fruit or another breakfast bar   Dinner  8:00pm- 2 slices stuffed crust pizza or  beans, rice, cheese quesadilla, chips   Snack (evening) no snack   Beverage(s) water, diet green tea     Exercise   Exercise Type ADL's     Patient Education   Nutrition management  Role of diet in the treatment of diabetes and the relationship between the three main macronutrients and blood glucose level;Food label reading, portion sizes and measuring food.;Carbohydrate counting;Reviewed blood glucose goals for pre and post meals and how to evaluate the patients' food intake on their blood glucose level.   Physical activity and exercise  Role of exercise on diabetes management, blood pressure control and cardiac health.   Preconception care Pregnancy and GDM  Role of pre-pregnancy blood glucose control on the  development of the fetus;Reviewed with patient blood glucose goals with pregnancy;Role of family planning for patients with diabetes   Personal strategies to promote health Lifestyle issues that need to be addressed for better diabetes care     Individualized Goals (developed by patient)   Nutrition Follow meal plan discussed   Physical Activity Exercise 3-5 times per week   Monitoring  test my blood glucose as discussed     Post-Education Assessment   Patient understands the diabetes disease and treatment process. Demonstrates understanding / competency   Patient understands incorporating nutritional management into lifestyle. Demonstrates understanding / competency   Patient undertands incorporating physical activity into lifestyle. Demonstrates understanding / competency   Patient understands monitoring blood glucose, interpreting and using results Demonstrates understanding / competency   Patient understands how to develop strategies to promote health/change behavior. Demonstrates understanding / competency     Outcomes   Program Status Completed      Learning Objective:  Patient will have a greater understanding of diabetes self-management. Patient education plan is to attend individual and/or group sessions per assessed needs and concerns.  Progress Note:  Patient's FBG readings are in 80's and 90's and most post meal readings are less than 120.   Based on food recall and post meal readings, most meals are in recommended carbohydrate range.  Diet is adequate in protein and low in fruits, vegetables and calcium sources.  Weight gain  of approximately 15 lbs during the pregnancy and 1 lb gain in past week.  Education: Instructed on a meal plan based on 2000 calories (45% CHO, 20% protein and 35% fat) including carbohydrate counting and how to balance carbohydrate, protein and fat. Also, reviewed reading food labels. Wrote individualized menus based on pateint's food  preferences. Reviewed food safety, inlcuding avoidance of Listeriosis. Discussed importance of maintaining  healthy lifestyle habits to reduce risk of Type 2 diabetes. Encouraged to have blood glucose tested annually as well as prior to attempting future pregnancies.   Plan:  Patient to include equivalent of 2 servings of carbohydrate at breakfast (30gms) and a protein food. No fruit at breakfast. To include a morning snack consisting of 1 serving of carbohydrate and a protein. To include 2-4 servings of carbohydrate at lunch and dinner. Include an afternoon snack  consisting of 1 serving of carbohydrate and protein. Read labels for carbohydrate gms. Continue to check blood sugars before and after breakfast and 2 hours after lunch and dinner.   Expected Outcomes:  Demonstrated interest in learning. Expect positive outcomes  Education material provided:  Planning a Balanced Meal Food Safety Handout Preventing Diabetes Personal Meal Plan  If problems or questions, patient to contact team via:  Earley Favor [phone]  Future DSME appointment: - PRN

## 2017-01-15 ENCOUNTER — Encounter: Payer: Self-pay | Admitting: Certified Nurse Midwife

## 2017-01-15 LAB — STREP GP B NAA+RFLX: Strep Gp B NAA+Rflx: NEGATIVE

## 2017-01-16 LAB — GC/CHLAMYDIA PROBE AMP
CHLAMYDIA, DNA PROBE: NEGATIVE
NEISSERIA GONORRHOEAE BY PCR: NEGATIVE

## 2017-01-20 ENCOUNTER — Encounter: Payer: Self-pay | Admitting: Obstetrics and Gynecology

## 2017-01-20 ENCOUNTER — Ambulatory Visit (INDEPENDENT_AMBULATORY_CARE_PROVIDER_SITE_OTHER): Payer: Medicaid Other | Admitting: Obstetrics and Gynecology

## 2017-01-20 VITALS — BP 139/87 | HR 90 | Wt 201.4 lb

## 2017-01-20 DIAGNOSIS — Z3493 Encounter for supervision of normal pregnancy, unspecified, third trimester: Secondary | ICD-10-CM

## 2017-01-20 LAB — POCT URINALYSIS DIPSTICK
Bilirubin, UA: NEGATIVE
Glucose, UA: NEGATIVE
KETONES UA: NEGATIVE
LEUKOCYTES UA: NEGATIVE
Nitrite, UA: NEGATIVE
PH UA: 6.5 (ref 5.0–8.0)
PROTEIN UA: NEGATIVE
RBC UA: NEGATIVE
SPEC GRAV UA: 1.01 (ref 1.010–1.025)
UROBILINOGEN UA: 0.2 U/dL

## 2017-01-20 NOTE — Progress Notes (Signed)
ROB & NST- pt is having some lower abd cramping 

## 2017-01-20 NOTE — Progress Notes (Signed)
ROB- GBS- reviewed, NST performed today was reviewed and was found to be reactive. Baseline145 with Moderate variability; No decels noted.  Continue recommended antenatal testing and prenatal care. Discussed IOl at term if not delivered by then,

## 2017-01-29 ENCOUNTER — Ambulatory Visit (INDEPENDENT_AMBULATORY_CARE_PROVIDER_SITE_OTHER): Payer: Medicaid Other | Admitting: Obstetrics and Gynecology

## 2017-01-29 ENCOUNTER — Other Ambulatory Visit: Payer: Medicaid Other

## 2017-01-29 ENCOUNTER — Other Ambulatory Visit: Payer: Self-pay | Admitting: Certified Nurse Midwife

## 2017-01-29 VITALS — BP 137/83 | HR 113 | Wt 199.4 lb

## 2017-01-29 DIAGNOSIS — Z3493 Encounter for supervision of normal pregnancy, unspecified, third trimester: Secondary | ICD-10-CM

## 2017-01-29 DIAGNOSIS — O24419 Gestational diabetes mellitus in pregnancy, unspecified control: Secondary | ICD-10-CM

## 2017-01-29 LAB — POCT URINALYSIS DIPSTICK
Bilirubin, UA: NEGATIVE
Glucose, UA: NEGATIVE
Ketones, UA: NEGATIVE
NITRITE UA: NEGATIVE
PH UA: 6.5 (ref 5.0–8.0)
Protein, UA: NEGATIVE
RBC UA: NEGATIVE
Spec Grav, UA: 1.01 (ref 1.010–1.025)
UROBILINOGEN UA: 0.2 U/dL

## 2017-01-29 NOTE — Progress Notes (Signed)
ROB & NST- NST performed today was reviewed and was found to be reactive. Baseline 145  with Moderate variability; No decels noted.  Continue recommended antenatal testing and prenatal care. Doesn't know sugars as battery out and hasn't replaced it in a week. Will get one today. For IOL 10/290at 8am if not delivered by then.

## 2017-01-29 NOTE — Progress Notes (Signed)
ROB & NST- pt is doing well 

## 2017-01-31 LAB — URINE CULTURE

## 2017-02-03 ENCOUNTER — Ambulatory Visit (INDEPENDENT_AMBULATORY_CARE_PROVIDER_SITE_OTHER): Payer: Medicaid Other

## 2017-02-03 ENCOUNTER — Encounter: Payer: Self-pay | Admitting: Certified Nurse Midwife

## 2017-02-03 ENCOUNTER — Ambulatory Visit (INDEPENDENT_AMBULATORY_CARE_PROVIDER_SITE_OTHER): Payer: Medicaid Other | Admitting: Certified Nurse Midwife

## 2017-02-03 VITALS — BP 135/98 | HR 97 | Wt 198.5 lb

## 2017-02-03 DIAGNOSIS — Z3493 Encounter for supervision of normal pregnancy, unspecified, third trimester: Secondary | ICD-10-CM

## 2017-02-03 DIAGNOSIS — O24419 Gestational diabetes mellitus in pregnancy, unspecified control: Secondary | ICD-10-CM | POA: Diagnosis not present

## 2017-02-03 DIAGNOSIS — O24410 Gestational diabetes mellitus in pregnancy, diet controlled: Secondary | ICD-10-CM

## 2017-02-03 LAB — POCT URINALYSIS DIPSTICK
BILIRUBIN UA: NEGATIVE
GLUCOSE UA: NEGATIVE
Ketones, UA: NEGATIVE
Nitrite, UA: NEGATIVE
PH UA: 8 (ref 5.0–8.0)
Protein, UA: NEGATIVE
RBC UA: NEGATIVE
UROBILINOGEN UA: 0.2 U/dL

## 2017-02-03 NOTE — Progress Notes (Signed)
ROB- Pt does have no complaints today, has experience some sharp pain in her lower abdominal area but has subsided

## 2017-02-03 NOTE — Patient Instructions (Signed)
Labor Induction Labor induction is when steps are taken to cause a pregnant woman to begin the labor process. Most women go into labor on their own between 37 weeks and 42 weeks of the pregnancy. When this does not happen or when there is a medical need, methods may be used to induce labor. Labor induction causes a pregnant woman's uterus to contract. It also causes the cervix to soften (ripen), open (dilate), and thin out (efface). Usually, labor is not induced before 39 weeks of the pregnancy unless there is a problem with the baby or mother. Before inducing labor, your health care provider will consider a number of factors, including the following:  The medical condition of you and the baby.  How many weeks along you are.  The status of the baby's lung maturity.  The condition of the cervix.  The position of the baby. What are the reasons for labor induction? Labor may be induced for the following reasons:  The health of the baby or mother is at risk.  The pregnancy is overdue by 1 week or more.  The water breaks but labor does not start on its own.  The mother has a health condition or serious illness, such as high blood pressure, infection, placental abruption, or diabetes.  The amniotic fluid amounts are low around the baby.  The baby is distressed. Convenience or wanting the baby to be born on a certain date is not a reason for inducing labor. What methods are used for labor induction? Several methods of labor induction may be used, such as:  Prostaglandin medicine. This medicine causes the cervix to dilate and ripen. The medicine will also start contractions. It can be taken by mouth or by inserting a suppository into the vagina.  Inserting a thin tube (catheter) with a balloon on the end into the vagina to dilate the cervix. Once inserted, the balloon is expanded with water, which causes the cervix to open.  Stripping the membranes. Your health care provider separates  amniotic sac tissue from the cervix, causing the cervix to be stretched and causing the release of a hormone called progesterone. This may cause the uterus to contract. It is often done during an office visit. You will be sent home to wait for the contractions to begin. You will then come in for an induction.  Breaking the water. Your health care provider makes a hole in the amniotic sac using a small instrument. Once the amniotic sac breaks, contractions should begin. This may still take hours to see an effect.  Medicine to trigger or strengthen contractions. This medicine is given through an IV access tube inserted into a vein in your arm. All of the methods of induction, besides stripping the membranes, will be done in the hospital. Induction is done in the hospital so that you and the baby can be carefully monitored. How long does it take for labor to be induced? Some inductions can take up to 2-3 days. Depending on the cervix, it usually takes less time. It takes longer when you are induced early in the pregnancy or if this is your first pregnancy. If a mother is still pregnant and the induction has been going on for 2-3 days, either the mother will be sent home or a cesarean delivery will be needed. What are the risks associated with labor induction? Some of the risks of induction include:  Changes in fetal heart rate, such as too high, too low, or erratic.  Fetal distress.    Chance of infection for the mother and baby.  Increased chance of having a cesarean delivery.  Breaking off (abruption) of the placenta from the uterus (rare).  Uterine rupture (very rare). When induction is needed for medical reasons, the benefits of induction may outweigh the risks. What are some reasons for not inducing labor? Labor induction should not be done if:  It is shown that your baby does not tolerate labor.  You have had previous surgeries on your uterus, such as a myomectomy or the removal of  fibroids.  Your placenta lies very low in the uterus and blocks the opening of the cervix (placenta previa).  Your baby is not in a head-down position.  The umbilical cord drops down into the birth canal in front of the baby. This could cut off the baby's blood and oxygen supply.  You have had a previous cesarean delivery.  There are unusual circumstances, such as the baby being extremely premature. This information is not intended to replace advice given to you by your health care provider. Make sure you discuss any questions you have with your health care provider. Document Released: 08/19/2006 Document Revised: 09/05/2015 Document Reviewed: 10/27/2012 Elsevier Interactive Patient Education  2017 Elsevier Inc.  

## 2017-02-03 NOTE — Progress Notes (Signed)
ROB and BPP today for GDM. BPP 6/8 for breathing movements. Will repeat on Friday. Discussed fetal movement/kick counts. BP elevated with repeat 120/89. She denies epigastric pain, negative clonus, 2+ reflexes bilaterally, no swelling , no visual disturbances. Blood sugar log. Fasting 85-89, 2 hr PP 110-117. Reviewed labor precautions return on Friday for repeat BPP.   ULTRASOUND REPORT  Location: [facility] Date of Service: 02/03/17  Indications:BPP & Growth for GDM Findings:  Mason JimSingleton intrauterine pregnancy is visualized with FHR at 155 BPM. Biometrics give an (U/S) Gestational age of 26 1/7 weeks and an (U/S) EDD of 02/09/17; this correlates with the clinically established EDD of 02/08/17.  Fetal presentation is vertex.  EFW: 3707 grams (8lb 3oz). 69th percentile. Placenta: Fundal and grade 2. AFI: 14.4cm.  BPP = 6/8 (No fetal breathing movements visualized throughout exam)   Impression: 1. 39 1/7 week Viable Singleton Intrauterine pregnancy by U/S. 2. (U/S) EDD is consistent with Clinically established (LMP) EDD of 02/08/17. 3. EFF = 3707 grams (8lb 3oz). 69th percentile. 4. BPP = 6/8 with no fetal breathing noted  Recommendations: 1.Clinical correlation with the patient's History and Physical Exam.   Kari BaarsJill Long, RDMS

## 2017-02-04 ENCOUNTER — Other Ambulatory Visit: Payer: Self-pay | Admitting: Certified Nurse Midwife

## 2017-02-04 DIAGNOSIS — O24419 Gestational diabetes mellitus in pregnancy, unspecified control: Secondary | ICD-10-CM

## 2017-02-05 ENCOUNTER — Encounter: Payer: Self-pay | Admitting: Certified Nurse Midwife

## 2017-02-05 ENCOUNTER — Ambulatory Visit (INDEPENDENT_AMBULATORY_CARE_PROVIDER_SITE_OTHER): Payer: Medicaid Other | Admitting: Certified Nurse Midwife

## 2017-02-05 ENCOUNTER — Ambulatory Visit (INDEPENDENT_AMBULATORY_CARE_PROVIDER_SITE_OTHER): Payer: Medicaid Other

## 2017-02-05 VITALS — BP 137/93 | HR 87 | Wt 204.2 lb

## 2017-02-05 DIAGNOSIS — O24419 Gestational diabetes mellitus in pregnancy, unspecified control: Secondary | ICD-10-CM | POA: Diagnosis not present

## 2017-02-05 DIAGNOSIS — Z3493 Encounter for supervision of normal pregnancy, unspecified, third trimester: Secondary | ICD-10-CM

## 2017-02-05 LAB — POCT URINALYSIS DIPSTICK
BILIRUBIN UA: NEGATIVE
Blood, UA: NEGATIVE
GLUCOSE UA: NEGATIVE
KETONES UA: NEGATIVE
LEUKOCYTES UA: NEGATIVE
Nitrite, UA: NEGATIVE
PROTEIN UA: NEGATIVE
Spec Grav, UA: 1.01 (ref 1.010–1.025)
Urobilinogen, UA: 0.2 E.U./dL
pH, UA: 7.5 (ref 5.0–8.0)

## 2017-02-05 NOTE — Progress Notes (Signed)
Follow up ultrasound from Wednesday. BPP today 8/8. ( See below for full report). She is to report to L&D on Monday as scheduled for induction.    Doreene BurkeAnnie Namon Villarin, CNM  ULTRASOUND REPORT  Location: [facility] Date of Service: 02/05/17  Indications: BPP for GDM Findings:  Singleton intrauterine pregnancy is visualized with FHR at 157 BPM.  Fetal presentation is vertex.  Placenta: . Fundal and grade 2. AFI: 13.3cm.  BPP = 8/8 with good visualization of fetal movement, tone, breathing, and AFI.  Impression: 1. BPP = 8/8  Recommendations: 1.Clinical correlation with the patient's History and Physical Exam.   Kari BaarsJill Long, RDMS

## 2017-02-05 NOTE — Progress Notes (Signed)
Rob and BPP.

## 2017-02-05 NOTE — Patient Instructions (Signed)
Labor Induction Labor induction is when steps are taken to cause a pregnant woman to begin the labor process. Most women go into labor on their own between 37 weeks and 42 weeks of the pregnancy. When this does not happen or when there is a medical need, methods may be used to induce labor. Labor induction causes a pregnant woman's uterus to contract. It also causes the cervix to soften (ripen), open (dilate), and thin out (efface). Usually, labor is not induced before 39 weeks of the pregnancy unless there is a problem with the baby or mother. Before inducing labor, your health care provider will consider a number of factors, including the following:  The medical condition of you and the baby.  How many weeks along you are.  The status of the baby's lung maturity.  The condition of the cervix.  The position of the baby. What are the reasons for labor induction? Labor may be induced for the following reasons:  The health of the baby or mother is at risk.  The pregnancy is overdue by 1 week or more.  The water breaks but labor does not start on its own.  The mother has a health condition or serious illness, such as high blood pressure, infection, placental abruption, or diabetes.  The amniotic fluid amounts are low around the baby.  The baby is distressed. Convenience or wanting the baby to be born on a certain date is not a reason for inducing labor. What methods are used for labor induction? Several methods of labor induction may be used, such as:  Prostaglandin medicine. This medicine causes the cervix to dilate and ripen. The medicine will also start contractions. It can be taken by mouth or by inserting a suppository into the vagina.  Inserting a thin tube (catheter) with a balloon on the end into the vagina to dilate the cervix. Once inserted, the balloon is expanded with water, which causes the cervix to open.  Stripping the membranes. Your health care provider separates  amniotic sac tissue from the cervix, causing the cervix to be stretched and causing the release of a hormone called progesterone. This may cause the uterus to contract. It is often done during an office visit. You will be sent home to wait for the contractions to begin. You will then come in for an induction.  Breaking the water. Your health care provider makes a hole in the amniotic sac using a small instrument. Once the amniotic sac breaks, contractions should begin. This may still take hours to see an effect.  Medicine to trigger or strengthen contractions. This medicine is given through an IV access tube inserted into a vein in your arm. All of the methods of induction, besides stripping the membranes, will be done in the hospital. Induction is done in the hospital so that you and the baby can be carefully monitored. How long does it take for labor to be induced? Some inductions can take up to 2-3 days. Depending on the cervix, it usually takes less time. It takes longer when you are induced early in the pregnancy or if this is your first pregnancy. If a mother is still pregnant and the induction has been going on for 2-3 days, either the mother will be sent home or a cesarean delivery will be needed. What are the risks associated with labor induction? Some of the risks of induction include:  Changes in fetal heart rate, such as too high, too low, or erratic.  Fetal distress.    Chance of infection for the mother and baby.  Increased chance of having a cesarean delivery.  Breaking off (abruption) of the placenta from the uterus (rare).  Uterine rupture (very rare). When induction is needed for medical reasons, the benefits of induction may outweigh the risks. What are some reasons for not inducing labor? Labor induction should not be done if:  It is shown that your baby does not tolerate labor.  You have had previous surgeries on your uterus, such as a myomectomy or the removal of  fibroids.  Your placenta lies very low in the uterus and blocks the opening of the cervix (placenta previa).  Your baby is not in a head-down position.  The umbilical cord drops down into the birth canal in front of the baby. This could cut off the baby's blood and oxygen supply.  You have had a previous cesarean delivery.  There are unusual circumstances, such as the baby being extremely premature. This information is not intended to replace advice given to you by your health care provider. Make sure you discuss any questions you have with your health care provider. Document Released: 08/19/2006 Document Revised: 09/05/2015 Document Reviewed: 10/27/2012 Elsevier Interactive Patient Education  2017 Elsevier Inc.  

## 2017-02-08 ENCOUNTER — Inpatient Hospital Stay: Payer: Medicaid Other | Admitting: Certified Registered"

## 2017-02-08 ENCOUNTER — Inpatient Hospital Stay
Admission: RE | Admit: 2017-02-08 | Discharge: 2017-02-10 | DRG: 807 | Disposition: A | Discharge status: Home / Self Care | Payer: Medicaid Other | Source: Ambulatory Visit | Attending: Certified Nurse Midwife | Admitting: Certified Nurse Midwife

## 2017-02-08 DIAGNOSIS — Z3A40 40 weeks gestation of pregnancy: Secondary | ICD-10-CM

## 2017-02-08 DIAGNOSIS — Z880 Allergy status to penicillin: Secondary | ICD-10-CM

## 2017-02-08 DIAGNOSIS — K219 Gastro-esophageal reflux disease without esophagitis: Secondary | ICD-10-CM | POA: Diagnosis present

## 2017-02-08 DIAGNOSIS — O24420 Gestational diabetes mellitus in childbirth, diet controlled: Principal | ICD-10-CM | POA: Diagnosis present

## 2017-02-08 DIAGNOSIS — O24419 Gestational diabetes mellitus in pregnancy, unspecified control: Secondary | ICD-10-CM

## 2017-02-08 DIAGNOSIS — O99214 Obesity complicating childbirth: Secondary | ICD-10-CM | POA: Diagnosis present

## 2017-02-08 DIAGNOSIS — E669 Obesity, unspecified: Secondary | ICD-10-CM | POA: Diagnosis present

## 2017-02-08 DIAGNOSIS — O9962 Diseases of the digestive system complicating childbirth: Secondary | ICD-10-CM | POA: Diagnosis present

## 2017-02-08 LAB — GLUCOSE, CAPILLARY
GLUCOSE-CAPILLARY: 75 mg/dL (ref 65–99)
Glucose-Capillary: 81 mg/dL (ref 65–99)
Glucose-Capillary: 79 mg/dL (ref 65–99)

## 2017-02-08 LAB — CBC
HCT: 36.4 % (ref 35.0–47.0)
Hemoglobin: 12.8 g/dL (ref 12.0–16.0)
MCH: 33.5 pg (ref 26.0–34.0)
MCHC: 35.2 g/dL (ref 32.0–36.0)
MCV: 95.2 fL (ref 80.0–100.0)
PLATELETS: 281 10*3/uL (ref 150–440)
RBC: 3.82 MIL/uL (ref 3.80–5.20)
RDW: 13.7 % (ref 11.5–14.5)
WBC: 10.3 10*3/uL (ref 3.6–11.0)

## 2017-02-08 LAB — COMPREHENSIVE METABOLIC PANEL
ALBUMIN: 2.9 g/dL — AB (ref 3.5–5.0)
ALT: 13 U/L — AB (ref 14–54)
AST: 18 U/L (ref 15–41)
Alkaline Phosphatase: 177 U/L — ABNORMAL HIGH (ref 38–126)
Anion gap: 7 (ref 5–15)
BUN: 14 mg/dL (ref 6–20)
CO2: 21 mmol/L — AB (ref 22–32)
Calcium: 9.8 mg/dL (ref 8.9–10.3)
Chloride: 107 mmol/L (ref 101–111)
Creatinine, Ser: 0.89 mg/dL (ref 0.44–1.00)
GFR calc non Af Amer: >60 mL/min (ref >60)
GLUCOSE: 87 mg/dL (ref 65–99)
Potassium: 3.9 mmol/L (ref 3.5–5.1)
SODIUM: 135 mmol/L (ref 135–145)
TOTAL PROTEIN: 6.9 g/dL (ref 6.5–8.1)
Total Bilirubin: 0.4 mg/dL (ref 0.3–1.2)

## 2017-02-08 LAB — TYPE AND SCREEN
ABO/RH(D): A POS
ANTIBODY SCREEN: NEGATIVE

## 2017-02-08 MED ORDER — TERBUTALINE SULFATE 1 MG/ML IJ SOLN: 0.2500 mg | Freq: Once | INTRAMUSCULAR | Status: DC | PRN

## 2017-02-08 MED ORDER — FENTANYL 2.5 MCG/ML W/ROPIVACAINE 0.15% IN NS 100 ML EPIDURAL (ARMC)
EPIDURAL | Status: AC
  Filled 2017-02-08: qty 100

## 2017-02-08 MED ORDER — OXYTOCIN 40 UNITS IN LACTATED RINGERS INFUSION - SIMPLE MED
1.0000 m[IU]/min | INTRAVENOUS | Status: DC
  Administered 2017-02-08: 2 m[IU]/min via INTRAVENOUS

## 2017-02-08 MED ORDER — FENTANYL 2.5 MCG/ML W/ROPIVACAINE 0.15% IN NS 100 ML EPIDURAL (ARMC)
EPIDURAL | Status: DC | PRN
  Administered 2017-02-08: 12 mL/h via EPIDURAL
  Administered 2017-02-08: 250 ug via EPIDURAL

## 2017-02-08 MED ORDER — SOD CITRATE-CITRIC ACID 500-334 MG/5ML PO SOLN: 30.0000 mL | ORAL | Status: DC | PRN

## 2017-02-08 MED ORDER — OXYTOCIN BOLUS FROM INFUSION
500.0000 mL | Freq: Once | INTRAVENOUS | Status: AC
  Administered 2017-02-09: 500 mL via INTRAVENOUS

## 2017-02-08 MED ORDER — OXYTOCIN 40 UNITS IN LACTATED RINGERS INFUSION - SIMPLE MED
2.5000 [IU]/h | INTRAVENOUS | Status: DC
  Administered 2017-02-09: 2.5 [IU]/h via INTRAVENOUS

## 2017-02-08 MED ORDER — OXYTOCIN 10 UNIT/ML IJ SOLN: 10.0000 [IU] | Freq: Once | INTRAMUSCULAR | Status: DC

## 2017-02-08 MED ORDER — LIDOCAINE HCL (PF) 1 % IJ SOLN
30.0000 mL | INTRAMUSCULAR | Status: DC | PRN
  Administered 2017-02-09: 30 mL via SUBCUTANEOUS

## 2017-02-08 MED ORDER — ACETAMINOPHEN 325 MG PO TABS
650.0000 mg | ORAL_TABLET | ORAL | Status: DC | PRN
  Administered 2017-02-08: 650 mg via ORAL
  Filled 2017-02-08: qty 2

## 2017-02-08 MED ORDER — LACTATED RINGERS IV SOLN
500.0000 mL | INTRAVENOUS | Status: DC | PRN
  Administered 2017-02-08 (×2): 1000 mL via INTRAVENOUS

## 2017-02-08 MED ORDER — MISOPROSTOL 25 MCG QUARTER TABLET: 50.0000 ug | ORAL_TABLET | ORAL | Status: DC | PRN

## 2017-02-08 MED ORDER — BUPIVACAINE HCL (PF) 0.25 % IJ SOLN
INTRAMUSCULAR | Status: DC | PRN
  Administered 2017-02-08: 5 mL via EPIDURAL
  Administered 2017-02-08: 3 mL via EPIDURAL

## 2017-02-08 MED ORDER — ONDANSETRON HCL 4 MG/2ML IJ SOLN
4.0000 mg | Freq: Four times a day (QID) | INTRAMUSCULAR | Status: DC | PRN
  Administered 2017-02-08: 4 mg via INTRAVENOUS
  Filled 2017-02-08: qty 2

## 2017-02-08 MED ORDER — LIDOCAINE HCL (PF) 1 % IJ SOLN
INTRAMUSCULAR | Status: DC | PRN
  Administered 2017-02-08: 3 mL via SUBCUTANEOUS

## 2017-02-08 MED ORDER — BUTORPHANOL TARTRATE 1 MG/ML IJ SOLN: 1.0000 mg | INTRAMUSCULAR | Status: DC | PRN

## 2017-02-08 NOTE — Anesthesia Procedure Notes (Signed)
Epidural Patient location during procedure: OB  Staffing Anesthesiologist: Naomie DeanKEPHART, WILLIAM K Resident/CRNA: Mathews ArgyleLOGAN, Koal Eslinger Performed: resident/CRNA   Preanesthetic Checklist Completed: patient identified, site marked, surgical consent, pre-op evaluation, timeout performed, IV checked, risks and benefits discussed and monitors and equipment checked  Epidural Patient position: sitting Prep: ChloraPrep and site prepped and draped Patient monitoring: heart rate, continuous pulse ox and blood pressure Approach: midline Location: L4-L5 Injection technique: LOR saline  Needle:  Needle type: Tuohy  Needle gauge: 17 G Needle length: 9 cm and 9 Needle insertion depth: 7 cm Catheter type: closed end flexible Catheter size: 19 Gauge Catheter at skin depth: 11 cm Test dose: negative and 1.5% lidocaine with Epi 1:200 K  Assessment Events: blood not aspirated, injection not painful, no injection resistance, negative IV test and no paresthesia  Additional Notes   Patient tolerated the insertion well without complications.Reason for block:procedure for pain

## 2017-02-08 NOTE — H&P (Signed)
History and Physical   HPI  Sandy Jenkins is a 26 y.o. G2P0010 at 6528w0d Estimated Date of Delivery: 02/08/17 who is being admitted for  induction of labor due to gestational diabetes, diet controlled.    OB History  Obstetric History   G2   P0   T0   P0   A1   L0    SAB1   TAB0   Ectopic0   Multiple0   Live Births0     # Outcome Date GA Lbr Len/2nd Weight Sex Delivery Anes PTL Lv  2 Current           1 SAB               PROBLEM LIST  Pregnancy complications or risks: Patient Active Problem List   Diagnosis Date Noted  . Labor and delivery, indication for care 02/08/2017  . Gestational diabetes 12/18/2016  . Gastroesophageal reflux in pregnancy 10/05/2016  . Obesity (BMI 30.0-34.9) 09/04/2016  . Pregnancy 08/05/2016  . Rubella non-immune status, antepartum 06/30/2016    Prenatal labs and studies: ABO, Rh: A/Positive/-- (03/15 1014) Antibody: Negative (03/15 1014) Rubella: <0.90 (03/15 1014) RPR: Non Reactive (03/15 1014)  HBsAg: Negative (03/15 1014)  HIV:   non reactive  GBS: Negative   Past Medical History:  Diagnosis Date  . Gestational diabetes   . History of miscarriage      No past surgical history on file.   Medications    Current Discharge Medication List    CONTINUE these medications which have NOT CHANGED   Details  ACCU-CHEK FASTCLIX LANCETS MISC 1 Stick by Percutaneous route 4 (four) times daily. Qty: 100 each, Refills: 12    glucose blood test strip Use as instructed Qty: 100 each, Refills: 12    Prenatal Vit-Fe Fumarate-FA (PRENATAL MULTIVITAMIN) TABS tablet Take 1 tablet by mouth daily at 12 noon.    ranitidine (ZANTAC) 150 MG tablet Take 1 tablet (150 mg total) by mouth 2 (two) times daily. Qty: 60 tablet, Refills: 3         Allergies  Penicillins  Review of Systems  Constitutional: negative Eyes: negative Ears, nose, mouth, throat, and face: negative Respiratory: negative Cardiovascular: negative Gastrointestinal:  negative Genitourinary:negative Integument/breast: negative Hematologic/lymphatic: negative Musculoskeletal:negative Neurological: negative Behavioral/Psych: negative Endocrine: negative Allergic/Immunologic: negative  Physical Exam  LMP 05/12/2016   Lungs:  CTA B Cardio: RRR without M/R/G Abd: Soft, gravid, NT Presentation: cephalic EXT: No C/C/ 1+ Edema DTRs: 2+ B CERVIX: 3.5/70%/-2  See Prenatal records for more detailed PE.     FHR:  Baseline: 140,  Moderate variability, accelerations present, no decelrationa  Toco: Occasional contractions present    Test Results  No results found for this or any previous visit (from the past 24 hour(s)). Group B Strep negative  Assessment   G2P0010 at 628w0d Estimated Date of Delivery: 02/08/17    Patient Active Problem List   Diagnosis Date Noted  . Labor and delivery, indication for care 02/08/2017  . Gestational diabetes 12/18/2016  . Gastroesophageal reflux in pregnancy 10/05/2016  . Obesity (BMI 30.0-34.9) 09/04/2016  . Pregnancy 08/05/2016  . Rubella non-immune status, antepartum 06/30/2016    Plan  1. Admit to L&D :   Induction  2. EFM:-- Category 1 3. Epidural if desired. Stadol for IV pain until epidural requested. 4. Admission labs    Doreene Burkennie Seniyah Esker, [place] 02/08/2017 8:11 AM

## 2017-02-08 NOTE — Progress Notes (Addendum)
LABOR NOTE   Sandy Jenkins 26 y.o.GP@ at [redacted]w[redacted]d Active phase labor.26  SUBJECTIVE:  Pt comfortable with epidural occasional pressure OBJECTIVE:  BP 110/71   Pulse (!) 113   Temp 98.8 F (37.1 C) (Oral)   Resp 18   Ht 5\' 3"  (1.6 m)   Wt 204 lb (92.5 kg)   LMP 05/12/2016   SpO2 99%   BMI 36.14 kg/m  No intake/output data recorded.  She has shown slow cervical change.  CERVIX: 9.75:  100%:   0:   mid position:   soft SVE:   Dilation: Lip/rim Effacement (%): 100 Station: 0, +1 Exam by:: RS CONTRACTIONS: regular, every 2-3 minutes FHR: Fetal heart tracing reviewed. Baseline: 155 bpm, Variability: Good {> 6 bpm), Accelerations: Reactive and Decelerations: Absent Category I  Analgesia: Epidural  Labs: Lab Results  Component Value Date   WBC 10.3 02/08/2017   HGB 12.8 02/08/2017   HCT 36.4 02/08/2017   MCV 95.2 02/08/2017   PLT 281 02/08/2017    ASSESSMENT: 1) Labor curve reviewed.       Progress: Active phase labor.     Membranes: ruptured, clear fluid       Active Problems:   Labor and delivery, indication for care GDM, diet controlled  PLAN: IV Pitocin induction and increase Pitocin rate  Doreene BurkeAnnie Fatema Rabe, CNM 02/08/2017 10:10 PM

## 2017-02-08 NOTE — Progress Notes (Signed)
LABOR NOTE   Sandy Jenkins 26 y.o.GP@ at 4231w0d Active phase labor.  SUBJECTIVE:  Starting to feel intermittent pressure with contractions OBJECTIVE:  BP 130/78   Pulse 89   Temp 98.2 F (36.8 C) (Oral)   Resp 18   Ht 5\' 3"  (1.6 m)   Wt 204 lb (92.5 kg)   LMP 05/12/2016   SpO2 98%   BMI 36.14 kg/m  No intake/output data recorded.  She has shown cervical change. CERVIX: 8.5cm:  100%:   -1:   mid position:   soft SVE:   Dilation: 8.5 Effacement (%): 100 Station: -1 Exam by:: Doreene BurkeAnnie Ahlayah Tarkowski  CNM CONTRACTIONS: regular, every 2-3 minutes FHR: Fetal heart tracing reviewed. Baseline: 150 bpm, Variability: Good {> 6 bpm), Accelerations: Reactive and Decelerations: variable Category II   Analgesia: Epidural  Labs: Lab Results  Component Value Date   WBC 10.3 02/08/2017   HGB 12.8 02/08/2017   HCT 36.4 02/08/2017   MCV 95.2 02/08/2017   PLT 281 02/08/2017    ASSESSMENT: 1) Labor curve reviewed.       Progress: Active phase labor.     Membranes: ruptured, clear fluid  2) Bloody show with SVE      Active Problems:   Labor and delivery, indication for care GDM, diet controlled   PLAN: continue present management, IV fluid bolus and change maternal position.   Doreene Burkennie Jermiah Howton, CNM 02/08/2017 6:15 PM

## 2017-02-08 NOTE — Progress Notes (Signed)
LABOR NOTE   Sandy Jenkins 26 y.o.GP@ at 4758w0d Active phase labor.  SUBJECTIVE:  Comfortable with epidural.  OBJECTIVE:  BP 122/72   Pulse 81   Temp 98 F (36.7 C) (Oral)   Resp 18   Ht 5\' 3"  (1.6 m)   Wt 204 lb (92.5 kg)   LMP 05/12/2016   SpO2 99%   BMI 36.14 kg/m  No intake/output data recorded.  She has shown cervical change. CERVIX: 7cm:  100%:   -1:   mid position:   soft SVE:   Dilation: 7 Effacement (%): 100 Station: -1 Exam by:: Sandy BurkeAnnie Mikenna Jenkins CNM CONTRACTIONS: regular, every 2-3 minutes FHR: Fetal heart tracing reviewed. Baseline: 140 bpm, Variability: Good {> 6 bpm), Accelerations: Reactive and Decelerations: Absent Category I   Analgesia: Epidural  Labs: Lab Results  Component Value Date   WBC 10.3 02/08/2017   HGB 12.8 02/08/2017   HCT 36.4 02/08/2017   MCV 95.2 02/08/2017   PLT 281 02/08/2017    ASSESSMENT: 1) Labor curve reviewed.       Progress: Active phase labor. and Adequate uterine activity - intensity and frequency.     Membranes: ruptured, clear fluid      2)  Placed right side with peanut ball  Active Problems:   Labor and delivery, indication for care gestational diabetes , diet controlled   PLAN: expectant management Anticipate SVD  Sandy Burkennie Sandy Jenkins, CNM 02/08/2017 4:57 PM

## 2017-02-08 NOTE — Progress Notes (Signed)
LABOR NOTE   Sandy Jenkins 26 y.o.GP@ at 3046w0d, induction for GDM. She is currently on Pitocin @ 1114mu/mL contracting but not uncomfortable. Discussed AROM with patient and family members. Reviewed benefits and risks, answered questions. She agrees to plan.  She has concern regarding pain and is requesting epidural.   SUBJECTIVE:  She is feeling pressure with contractions OBJECTIVE:  BP 120/85   Pulse 91   Temp 98.1 F (36.7 C)   Resp 18   Ht 5\' 3"  (1.6 m)   Wt 204 lb (92.5 kg)   LMP 05/12/2016   SpO2 100%   BMI 36.14 kg/m  No intake/output data recorded.  She has shown cervical change. CERVIX: 4.5:  70:   -2:   posterior:   firm SVE:   Dilation: 4.5 Effacement (%): 70 Station: -2 Exam by:: Doreene BurkeAnnie Catrena Vari CNM CONTRACTIONS: regular, every 2-3 minutes FHR: Fetal heart tracing reviewed. Baseline: 140 bpm, Variability: Good {> 6 bpm), Accelerations: Reactive and Decelerations: Early Category I   Analgesia: Labor support without medications and plans for epidural   Labs: Lab Results  Component Value Date   WBC 10.3 02/08/2017   HGB 12.8 02/08/2017   HCT 36.4 02/08/2017   MCV 95.2 02/08/2017   PLT 281 02/08/2017    ASSESSMENT: 1) Labor curve reviewed.       Progress: Not in labor. and AROM     Membranes: ruptured, clear fluid       Active Problems:   Labor and delivery, indication for care   PLAN: continue present management  Doreene Burkennie Husam Hohn, CNM 02/08/2017 12:51 PM

## 2017-02-08 NOTE — Anesthesia Preprocedure Evaluation (Signed)
Anesthesia Evaluation  Patient identified by MRN, date of birth, ID band Patient awake    Reviewed: H&P , NPO status , Patient's Chart, lab work & pertinent test results  Airway Mallampati: III  TM Distance: >3 FB Neck ROM: full    Dental  (+) Dental Advidsory Given   Pulmonary Current Smoker,    Pulmonary exam normal        Cardiovascular negative cardio ROS Normal cardiovascular exam     Neuro/Psych negative neurological ROS  negative psych ROS   GI/Hepatic Neg liver ROS,   Endo/Other  diabetes  Renal/GU negative Renal ROS  negative genitourinary   Musculoskeletal   Abdominal   Peds  Hematology negative hematology ROS (+)   Anesthesia Other Findings   Reproductive/Obstetrics (+) Pregnancy                             Anesthesia Physical Anesthesia Plan  ASA: II  Anesthesia Plan: Epidural   Post-op Pain Management:    Induction:   PONV Risk Score and Plan:   Airway Management Planned:   Additional Equipment:   Intra-op Plan:   Post-operative Plan:   Informed Consent: I have reviewed the patients History and Physical, chart, labs and discussed the procedure including the risks, benefits and alternatives for the proposed anesthesia with the patient or authorized representative who has indicated his/her understanding and acceptance.   Dental Advisory Given  Plan Discussed with: CRNA and Anesthesiologist  Anesthesia Plan Comments:         Anesthesia Quick Evaluation

## 2017-02-09 ENCOUNTER — Encounter: Payer: Self-pay | Admitting: *Deleted

## 2017-02-09 DIAGNOSIS — O24420 Gestational diabetes mellitus in childbirth, diet controlled: Secondary | ICD-10-CM

## 2017-02-09 DIAGNOSIS — Z3A40 40 weeks gestation of pregnancy: Secondary | ICD-10-CM

## 2017-02-09 LAB — GLUCOSE, RANDOM: GLUCOSE: 78 mg/dL (ref 65–99)

## 2017-02-09 LAB — CBC
HEMATOCRIT: 32.9 % — AB (ref 35.0–47.0)
HEMOGLOBIN: 11.5 g/dL — AB (ref 12.0–16.0)
MCH: 33.7 pg (ref 26.0–34.0)
MCHC: 35 g/dL (ref 32.0–36.0)
MCV: 96.1 fL (ref 80.0–100.0)
Platelets: 229 10*3/uL (ref 150–440)
RBC: 3.42 MIL/uL — ABNORMAL LOW (ref 3.80–5.20)
RDW: 13.9 % (ref 11.5–14.5)
WBC: 20.1 10*3/uL — AB (ref 3.6–11.0)

## 2017-02-09 LAB — GLUCOSE, CAPILLARY: GLUCOSE-CAPILLARY: 75 mg/dL (ref 65–99)

## 2017-02-09 LAB — RPR: RPR Ser Ql: NONREACTIVE

## 2017-02-09 MED ORDER — IBUPROFEN 600 MG PO TABS
600.0000 mg | ORAL_TABLET | Freq: Four times a day (QID) | ORAL | Status: DC
  Administered 2017-02-09 – 2017-02-10 (×4): 600 mg via ORAL
  Filled 2017-02-09 (×5): qty 1

## 2017-02-09 MED ORDER — TETANUS-DIPHTH-ACELL PERTUSSIS 5-2.5-18.5 LF-MCG/0.5 IM SUSP: 0.5000 mL | Freq: Once | INTRAMUSCULAR | Status: DC

## 2017-02-09 MED ORDER — ACETAMINOPHEN 325 MG PO TABS: 650.0000 mg | ORAL_TABLET | ORAL | Status: DC | PRN

## 2017-02-09 MED ORDER — FERROUS SULFATE 325 (65 FE) MG PO TABS
325.0000 mg | ORAL_TABLET | Freq: Every day | ORAL | Status: DC
  Administered 2017-02-09 – 2017-02-10 (×2): 325 mg via ORAL
  Filled 2017-02-09 (×2): qty 1

## 2017-02-09 MED ORDER — ONDANSETRON HCL 4 MG PO TABS: 4.0000 mg | ORAL_TABLET | ORAL | Status: DC | PRN

## 2017-02-09 MED ORDER — OXYCODONE-ACETAMINOPHEN 5-325 MG PO TABS: 1.0000 | ORAL_TABLET | ORAL | Status: DC | PRN

## 2017-02-09 MED ORDER — SIMETHICONE 80 MG PO CHEW: 80.0000 mg | CHEWABLE_TABLET | ORAL | Status: DC | PRN

## 2017-02-09 MED ORDER — METHYLERGONOVINE MALEATE 0.2 MG PO TABS: 0.2000 mg | ORAL_TABLET | ORAL | Status: DC | PRN

## 2017-02-09 MED ORDER — DIBUCAINE 1 % RE OINT
1.0000 "application " | TOPICAL_OINTMENT | RECTAL | Status: DC | PRN
  Administered 2017-02-09: 1 via RECTAL
  Filled 2017-02-09: qty 28

## 2017-02-09 MED ORDER — BENZOCAINE-MENTHOL 20-0.5 % EX AERO
1.0000 | INHALATION_SPRAY | CUTANEOUS | Status: DC | PRN
Start: 2017-02-09 — End: 2017-02-10

## 2017-02-09 MED ORDER — OXYCODONE-ACETAMINOPHEN 5-325 MG PO TABS: 2.0000 | ORAL_TABLET | ORAL | Status: DC | PRN

## 2017-02-09 MED ORDER — COCONUT OIL OIL
1.0000 "application " | TOPICAL_OIL | Status: DC | PRN
  Administered 2017-02-09: 1 via TOPICAL
  Filled 2017-02-09: qty 120

## 2017-02-09 MED ORDER — SENNOSIDES-DOCUSATE SODIUM 8.6-50 MG PO TABS: 2.0000 | ORAL_TABLET | ORAL | Status: DC

## 2017-02-09 MED ORDER — PRENATAL MULTIVITAMIN CH
1.0000 | ORAL_TABLET | Freq: Every day | ORAL | Status: DC
  Administered 2017-02-09: 1 via ORAL
  Filled 2017-02-09: qty 1

## 2017-02-09 MED ORDER — WITCH HAZEL-GLYCERIN EX PADS
1.0000 "application " | MEDICATED_PAD | CUTANEOUS | Status: DC | PRN
  Administered 2017-02-09: 1 via TOPICAL
  Filled 2017-02-09: qty 100

## 2017-02-09 MED ORDER — ONDANSETRON HCL 4 MG/2ML IJ SOLN: 4.0000 mg | INTRAMUSCULAR | Status: DC | PRN

## 2017-02-09 MED ORDER — METHYLERGONOVINE MALEATE 0.2 MG/ML IJ SOLN: 0.2000 mg | INTRAMUSCULAR | Status: DC | PRN

## 2017-02-09 MED ORDER — DOCUSATE SODIUM 100 MG PO CAPS
100.0000 mg | ORAL_CAPSULE | Freq: Two times a day (BID) | ORAL | Status: DC
  Administered 2017-02-09 – 2017-02-10 (×2): 100 mg via ORAL
  Filled 2017-02-09 (×2): qty 1

## 2017-02-09 NOTE — Progress Notes (Signed)
Progress Note - Vaginal Delivery  Sandy Jenkins is a 26 y.o. G2P1010 now PP day 0 s/p Vaginal, Spontaneous Delivery .   Subjective:  The patient reports no complaints, up ad lib, voiding and tolerating PO  Objective:  Vital signs in last 24 hours: Temp:  [98 F (36.7 C)-99.4 F (37.4 C)] 98.2 F (36.8 C) (10/30 0822) Pulse Rate:  [80-143] 87 (10/30 0822) Resp:  [18] 18 (10/30 0822) BP: (110-144)/(59-88) 130/80 (10/30 0822) SpO2:  [97 %-100 %] 98 % (10/30 [id number])  Physical Exam:  General: alert, cooperative and appears stated age Lochia: appropriate Uterine Fundus: firm DVT Evaluation: No evidence of DVT seen on physical exam. No cords or calf tenderness. No significant calf/ankle edema.    Data Review  Recent Labs  02/08/17 0914 02/09/17 0802  HGB 12.8 11.5*  HCT 36.4 32.9*    Assessment/Plan: Active Problems:   Labor and delivery, indication for care   Plan for discharge tomorrow and Breastfeeding  -- Continue routine PP care.     Doreene Burkennie Negan Grudzien, CNM 02/09/2017 9:41 AM

## 2017-02-09 NOTE — Addendum Note (Signed)
Addendum  created 02/09/17 1819 by Mathews ArgyleLogan, Deepika [place], CRNA   Anesthesia Staff edited

## 2017-02-09 NOTE — Addendum Note (Signed)
Addendum  created 02/09/17 1823 by Mathews ArgyleLogan, Dajuana Palen, CRNA   Anesthesia Staff edited

## 2017-02-09 NOTE — Anesthesia Postprocedure Evaluation (Signed)
Anesthesia Post Note  Patient: Sandy Jenkins  Procedure(s) Performed: AN AD HOC LABOR EPIDURAL  Patient location during evaluation: Mother Baby Anesthesia Type: Epidural Level of consciousness: awake and alert Pain management: pain level controlled Vital Signs Assessment: post-procedure vital signs reviewed and stable Respiratory status: spontaneous breathing, nonlabored ventilation and respiratory function stable Cardiovascular status: stable Postop Assessment: no headache, no backache and epidural receding Anesthetic complications: no     Last Vitals:  Vitals:   02/09/17 0311 02/09/17 0822  BP: (!) 144/85 130/80  Pulse: 99 87  Resp: 18 18  Temp: 36.7 C 36.8 C  SpO2: 99% 98%    Last Pain:  Vitals:   02/09/17 0822  TempSrc: Oral  PainSc:                  Rica MastBachich,  Orene Abbasi M

## 2017-02-10 MED ORDER — DOCUSATE SODIUM 100 MG PO CAPS: 100.0000 mg | ORAL_CAPSULE | Freq: Two times a day (BID) | ORAL | 0 refills | Status: DC

## 2017-02-10 MED ORDER — DIBUCAINE 1 % RE OINT: 1.0000 "application " | TOPICAL_OINTMENT | RECTAL | 0 refills | Status: DC | PRN

## 2017-02-10 MED ORDER — IBUPROFEN 600 MG PO TABS: 600.0000 mg | ORAL_TABLET | Freq: Four times a day (QID) | ORAL | 0 refills | Status: DC

## 2017-02-10 MED ORDER — BENZOCAINE-MENTHOL 20-0.5 % EX AERO: 1.0000 "application " | INHALATION_SPRAY | CUTANEOUS | 0 refills | Status: DC | PRN

## 2017-02-10 MED ORDER — WITCH HAZEL-GLYCERIN EX PADS: 1.0000 "application " | MEDICATED_PAD | CUTANEOUS | 12 refills | Status: DC | PRN

## 2017-02-10 NOTE — Discharge Summary (Signed)
Discharge Summary  Date of Admission: 02/08/2017  Date of Discharge: 02/10/17  Admitting Diagnosis: Induction of labor at [redacted]w[redacted]d for GDM, diet controlled  Mode of Delivery: normal spontaneous vaginal delivery                 Discharge Diagnosis: No other diagnosis   Intrapartum Procedures: epidural and laceration 2nd degree and bilateral labial    Post partum procedures: none  Complications: 2 degree perineal laceration and bilateral labial                      Discharge Day SOAP Note:  Progress Note - Vaginal Delivery  Sandy Jenkins is a 26 y.o. G2P1010 now PP day 1 s/p Vaginal, Spontaneous Delivery . Delivery was complicated by gestational diabetes  Subjective  The patient has the following complaints: has no unusual complaints  Pain is controlled with current medications.   Patient is urinating without difficulty.  She is ambulating well.    Objective  Vital signs: BP 122/64 (BP Location: Right Arm)   Pulse 86   Temp 98 F (36.7 C) (Oral)   Resp 18   Ht 5\' 3"  (1.6 m)   Wt 204 lb (92.5 kg)   LMP 05/12/2016   SpO2 98%   Breastfeeding? Unknown   BMI 36.14 kg/m   Physical Exam: Gen: NAD Lungs clear bilaterally,  Heart: RRR Fundus Fundal Tone: Firm  Lochia Amount: Small  Perineum Appearance: Edematous     Data Review Labs: CBC Latest Ref Rng & Units 02/09/2017 02/08/2017 12/03/2016  WBC 3.6 - 11.0 K/uL 20.1(H) 10.3 13.3(H)  Hemoglobin 12.0 - 16.0 g/dL 11.5(L) 12.8 11.7  Hematocrit 35.0 - 47.0 % 32.9(L) 36.4 33.2(L)  Platelets 150 - 440 K/uL 229 281 290   A POS  Assessment/Plan  Active Problems:   Labor and delivery, indication for care    Plan for discharge today.   Discharge Instructions: Per After Visit Summary. Activity: Advance as tolerated. Pelvic rest for 6 weeks.  Also refer to After Visit Summary Diet: Regular Medications: Allergies as of 02/10/2017      Reactions   Penicillins       Medication List     STOP taking these medications   ACCU-CHEK FASTCLIX LANCETS Misc   glucose blood test strip   ranitidine 150 MG tablet Commonly known as:  ZANTAC     TAKE these medications   benzocaine-Menthol 20-0.5 % Aero Commonly known as:  DERMOPLAST Apply 1 application topically as needed for irritation (perineal discomfort).   dibucaine 1 % Oint Commonly known as:  NUPERCAINAL Place 1 application rectally as needed for hemorrhoids.   docusate sodium 100 MG capsule Commonly known as:  COLACE Take 1 capsule (100 mg total) by mouth 2 (two) times daily.   ibuprofen 600 MG tablet Commonly known as:  ADVIL,MOTRIN Take 1 tablet (600 mg total) by mouth every 6 (six) hours.   prenatal multivitamin Tabs tablet Take 1 tablet by mouth daily at 12 noon.   witch hazel-glycerin pad Commonly known as:  TUCKS Apply 1 application topically as needed for hemorrhoids.      Outpatient follow up:  Postpartum contraception: She is undecided, will discuss at first office visit post-partum. Contraceptive counseling completed regarding return of ovulation postpartum to prevent unplanned pregnancy.  Return in 6 wks for PP and 2 hr GTT   Discharged Condition: good  Discharged to: home  Newborn Data: Disposition:home with mother  Apgars: APGAR (1 MIN): 8  APGAR (5 MINS): 9   APGAR (10 MINS):    Baby Feeding: Breast    Doreene Burkennie Rogerio Boutelle, CNM 02/10/2017 7:31 AM

## 2017-02-10 NOTE — Final Progress Note (Signed)
Discharge Day SOAP Note:  Progress Note - Vaginal Delivery  Sandy Jenkins is a 26 y.o. G2P1010 now PP day 1 s/p Vaginal, Spontaneous Delivery . Delivery was complicated by gestational diabetes  Subjective  The patient has the following complaints: has no unusual complaints  Pain is controlled with current medications.   Patient is urinating without difficulty.  She is ambulating well.    Objective  Vital signs: BP 122/64 (BP Location: Right Arm)   Pulse 86   Temp 98 F (36.7 C) (Oral)   Resp 18   Ht 5\' 3"  (1.6 m)   Wt 204 lb (92.5 kg)   LMP 05/12/2016   SpO2 98%   Breastfeeding? Unknown   BMI 36.14 kg/m   Physical Exam: Gen: NAD Lungs clear bilaterally,  Heart: RRR Fundus Fundal Tone: Firm  Lochia Amount: Small  Perineum Appearance: Edematous                Data Review Labs: CBC Latest Ref Rng & Units 02/09/2017 02/08/2017 12/03/2016  WBC 3.6 - 11.0 K/uL 20.1(H) 10.3 13.3(H)  Hemoglobin 12.0 - 16.0 g/dL 11.5(L) 12.8 11.7  Hematocrit 35.0 - 47.0 % 32.9(L) 36.4 33.2(L)  Platelets 150 - 440 K/uL 229 281 290   A POS  Assessment/Plan  Active Problems:   Labor and delivery, indication for care    Plan for discharge today.   Discharge Instructions: Per After Visit Summary. Activity: Advance as tolerated. Pelvic rest for 6 weeks.  Also refer to After Visit Summary Diet: Regular Medications:     Allergies as of 02/10/2017      Reactions   Penicillins               Medication List     STOP taking these medications   ACCU-CHEK FASTCLIX LANCETS Misc   glucose blood test strip   ranitidine 150 MG tablet Commonly known as:  ZANTAC     TAKE these medications   benzocaine-Menthol 20-0.5 % Aero Commonly known as:  DERMOPLAST Apply 1 application topically as needed for irritation (perineal discomfort).   dibucaine 1 % Oint Commonly known as:  NUPERCAINAL Place 1 application rectally as needed for hemorrhoids.   docusate  sodium 100 MG capsule Commonly known as:  COLACE Take 1 capsule (100 mg total) by mouth 2 (two) times daily.   ibuprofen 600 MG tablet Commonly known as:  ADVIL,MOTRIN Take 1 tablet (600 mg total) by mouth every 6 (six) hours.   prenatal multivitamin Tabs tablet Take 1 tablet by mouth daily at 12 noon.   witch hazel-glycerin pad Commonly known as:  TUCKS Apply 1 application topically as needed for hemorrhoids.      Outpatient follow up:  Postpartum contraception: She is undecided, will discuss at first office visit post-partum. Contraceptive counseling completed regarding return of ovulation postpartum to prevent unplanned pregnancy.  Return in 6 wks for PP and 2 hr GTT   Discharged Condition: good  Discharged to: home  Newborn Data: Disposition:home with mother  Apgars: APGAR (1 MIN): 8   APGAR (5 MINS): 9   APGAR (10 MINS):    Baby Feeding: Breast    Doreene Burkennie Solyana Nonaka, CNM 02/10/2017 7:31 AM

## 2017-02-10 NOTE — Progress Notes (Signed)
Patient stable and discharged to home with family and newborn. Patient has been given discharge instructions and states that she understands and has no further questions about instructions or prescriptions.  Sandy LimaSolange Shamara Soza, Charity fundraiserN.

## 2017-10-04 ENCOUNTER — Ambulatory Visit (INDEPENDENT_AMBULATORY_CARE_PROVIDER_SITE_OTHER): Payer: Medicaid Other | Admitting: Certified Nurse Midwife

## 2017-10-04 VITALS — BP 104/66 | HR 89 | Ht 63.0 in | Wt 182.1 lb

## 2017-10-04 DIAGNOSIS — N926 Irregular menstruation, unspecified: Secondary | ICD-10-CM

## 2017-10-04 LAB — POCT URINE PREGNANCY: Preg Test, Ur: POSITIVE — AB

## 2017-10-04 NOTE — Progress Notes (Signed)
Pt is here for a confirmation of pregnancy. Has 1 positive home test.

## 2017-10-04 NOTE — Patient Instructions (Signed)
Eating Plan for Pregnant Women While you are pregnant, your body will require additional nutrition to help support your growing baby. It is recommended that you consume:  150 additional calories each day during your first trimester.  300 additional calories each day during your second trimester.  300 additional calories each day during your third trimester.  Eating a healthy, well-balanced diet is very important for your health and for your baby's health. You also have a higher need for some vitamins and minerals, such as folic acid, calcium, iron, and vitamin D. What do I need to know about eating during pregnancy?  Do not try to lose weight or go on a diet during pregnancy.  Choose healthy, nutritious foods. Choose  of a sandwich with a glass of milk instead of a candy bar or a high-calorie sugar-sweetened beverage.  Limit your overall intake of foods that have "empty calories." These are foods that have little nutritional value, such as sweets, desserts, candies, sugar-sweetened beverages, and fried foods.  Eat a variety of foods, especially fruits and vegetables.  Take a prenatal vitamin to help meet the additional needs during pregnancy, specifically for folic acid, iron, calcium, and vitamin D.  Remember to stay active. Ask your health care provider for exercise recommendations that are specific to you.  Practice good food safety and cleanliness, such as washing your hands before you eat and after you prepare raw meat. This helps to prevent foodborne illnesses, such as listeriosis, that can be very dangerous for your baby. Ask your health care provider for more information about listeriosis. What does 150 extra calories look like? Healthy options for an additional 150 calories each day could be any of the following:  Plain low-fat yogurt (6-8 oz) with  cup of berries.  1 apple with 2 teaspoons of peanut butter.  Cut-up vegetables with  cup of hummus.  Low-fat chocolate milk  (8 oz or 1 cup).  1 string cheese with 1 medium orange.   of a peanut butter and jelly sandwich on whole-wheat bread (1 tsp of peanut butter).  For 300 calories, you could eat two of those healthy options each day. What is a healthy amount of weight to gain? The recommended amount of weight for you to gain is based on your pre-pregnancy BMI. If your pre-pregnancy BMI was:  Less than 18 (underweight), you should gain 28-40 lb.  18-24.9 (normal), you should gain 25-35 lb.  25-29.9 (overweight), you should gain 15-25 lb.  Greater than 30 (obese), you should gain 11-20 lb.  What if I am having twins or multiples? Generally, pregnant women who will be having twins or multiples may need to increase their daily calories by 300-600 calories each day. The recommended range for total weight gain is 25-54 lb, depending on your pre-pregnancy BMI. Talk with your health care provider for specific guidance about additional nutritional needs, weight gain, and exercise during your pregnancy. What foods can I eat? Grains Any grains. Try to choose whole grains, such as whole-wheat bread, oatmeal, or brown rice. Vegetables Any vegetables. Try to eat a variety of colors and types of vegetables to get a full range of vitamins and minerals. Remember to wash your vegetables well before eating. Fruits Any fruits. Try to eat a variety of colors and types of fruit to get a full range of vitamins and minerals. Remember to wash your fruits well before eating. Meats and Other Protein Sources Lean meats, including chicken, [place], fish, and lean cuts of beef, veal,  or pork. Make sure that all meats are cooked to "well done." Tofu. Tempeh. Beans. Eggs. Peanut butter and other nut butters. Seafood, such as shrimp, crab, and lobster. If you choose fish, select types that are higher in omega-3 fatty acids, including salmon, herring, mussels, trout, sardines, and pollock. Make sure that all meats are cooked to food-safe  temperatures. Dairy Pasteurized milk and milk alternatives. Pasteurized yogurt and pasteurized cheese. Cottage cheese. Sour cream. Beverages Water. Juices that contain 100% fruit juice or vegetable juice. Caffeine-free teas and decaffeinated coffee. Drinks that contain caffeine are okay to drink, but it is better to avoid caffeine. Keep your total caffeine intake to less than 200 mg each day (12 oz of coffee, tea, or soda) or as directed by your health care provider. Condiments Any pasteurized condiments. Sweets and Desserts Any sweets and desserts. Fats and Oils Any fats and oils. The items listed above may not be a complete list of recommended foods or beverages. Contact your dietitian for more options. What foods are not recommended? Vegetables Unpasteurized (raw) vegetable juices. Fruits Unpasteurized (raw) fruit juices. Meats and Other Protein Sources Cured meats that have nitrates, such as bacon, salami, and hotdogs. Luncheon meats, bologna, or other deli meats (unless they are reheated until they are steaming hot). Refrigerated pate, meat spreads from a meat counter, smoked seafood that is found in the refrigerated section of a store. Raw fish, such as sushi or sashimi. High mercury content fish, such as tilefish, shark, swordfish, and king mackerel. Raw meats, such as tuna or beef tartare. Undercooked meats and poultry. Make sure that all meats are cooked to food-safe temperatures. Dairy Unpasteurized (raw) milk and any foods that have raw milk in them. Soft cheeses, such as feta, queso blanco, queso fresco, Brie, Camembert cheeses, blue-veined cheeses, and Panela cheese (unless it is made with pasteurized milk, which must be stated on the label). Beverages Alcohol. Sugar-sweetened beverages, such as sodas, teas, or energy drinks. Condiments Homemade fermented foods and drinks, such as pickles, sauerkraut, or kombucha drinks. (Store-bought pasteurized versions of these are  okay.) Other Salads that are made in the store, such as ham salad, chicken salad, egg salad, tuna salad, and seafood salad. The items listed above may not be a complete list of foods and beverages to avoid. Contact your dietitian for more information. This information is not intended to replace advice given to you by your health care provider. Make sure you discuss any questions you have with your health care provider. Document Released: 01/12/2014 Document Revised: 09/05/2015 Document Reviewed: 09/12/2013 Elsevier Interactive Patient Education  2018 [employer]. Common Medications Safe in Pregnancy  Acne:      Constipation:  Benzoyl Peroxide     Colace  Clindamycin      Dulcolax Suppository  Topica Erythromycin     Fibercon  Salicylic Acid      Metamucil         Miralax AVOID:        Senakot   Accutane    Cough:  Retin-A       Cough Drops  Tetracycline      Phenergan w/ Codeine if Rx  Minocycline      Robitussin (Plain & DM)  Antibiotics:     Crabs/Lice:  Ceclor       RID  Cephalosporins    AVOID:  E-Mycins      Kwell  Keflex  Macrobid/Macrodantin   Diarrhea:  Penicillin      Kao-Pectate  Zithromax  Imodium AD         PUSH FLUIDS AVOID:       Cipro     Fever:  Tetracycline      Tylenol (Regular or Extra  Minocycline       Strength)  Levaquin      Extra Strength-Do not          Exceed 8 tabs/24 hrs Caffeine:        <262m/day (equiv. To 1 cup of coffee or  approx. 3 12 oz sodas)         Gas: Cold/Hayfever:       Gas-X  Benadryl      Mylicon  Claritin       Phazyme  **Claritin-D        Chlor-Trimeton    Headaches:  Dimetapp      ASA-Free Excedrin  Drixoral-Non-Drowsy     Cold Compress  Mucinex (Guaifenasin)     Tylenol (Regular or Extra  Sudafed/Sudafed-12 Hour     Strength)  **Sudafed PE Pseudoephedrine   Tylenol Cold & Sinus     Vicks Vapor Rub  Zyrtec  **AVOID if Problems With Blood Pressure         Heartburn: Avoid lying down for at least 1 hour  after meals  Aciphex      Maalox     Rash:  Milk of Magnesia     Benadryl    Mylanta       1% Hydrocortisone Cream  Pepcid  Pepcid Complete   Sleep Aids:  Prevacid      Ambien   Prilosec       Benadryl  Rolaids       Chamomile Tea  Tums (Limit 4/day)     Unisom  Zantac       Tylenol PM         Warm milk-add vanilla or  Hemorrhoids:       Sugar for taste  Anusol/Anusol H.C.  (RX: Analapram 2.5%)  Sugar Substitutes:  Hydrocortisone OTC     Ok in moderation  Preparation H      Tucks        Vaseline lotion applied to tissue with wiping    Herpes:     Throat:  Acyclovir      Oragel  Famvir  Valtrex     Vaccines:         Flu Shot Leg Cramps:       *Gardasil  Benadryl      Hepatitis A         Hepatitis B Nasal Spray:       Pneumovax  Saline Nasal Spray     Polio Booster         Tetanus Nausea:       Tuberculosis test or PPD  Vitamin B6 25 mg TID   AVOID:    Dramamine      *Gardasil  Emetrol       Live Poliovirus  Ginger Root 250 mg QID    MMR (measles, mumps &  High Complex Carbs @ Bedtime    rebella)  Sea Bands-Accupressure    Varicella (Chickenpox)  Unisom 1/2 tab TID     *No known complications           If received before Pain:         Known pregnancy;   Darvocet       Resume series after  Lortab        Delivery  Percocet  Yeast:   Tramadol      Femstat  Tylenol 3      Gyne-lotrimin  Ultram       Monistat  Vicodin           MISC:         All Sunscreens           Hair Coloring/highlights          Insect Repellant's          (Including DEET)         Mystic Tans

## 2017-10-04 NOTE — Progress Notes (Signed)
Subjective:    Sandy Jenkins is a 27 y.o. female who presents for evaluation of amenorrhea. She believes she could be pregnant. Pregnancy is desired. Sexual Activity: single partner, contraception: none. Current symptoms also include: fatigue and nausea. Last period was normal.   Patient's last menstrual period was 08/24/2017 (approximate). The following portions of the patient's history were reviewed and updated as appropriate: allergies, current medications, past family history, past medical history, past social history, past surgical history and problem list.  Review of Systems Pertinent items are noted in HPI.     Objective:    BP 104/66   Pulse 89   Ht 5\' 3"  (1.6 m)   Wt 182 lb 1 oz (82.6 kg)   LMP 08/24/2017 (Approximate)   Breastfeeding? No   BMI 32.25 kg/m  General: alert, cooperative, appears stated age, no distress and no acute distress    Lab Review Urine HCG: positive    Assessment:    Absence of menstruation.     Plan:  Positive: EDC: 05/31/18. Briefly discussed pre-natal care options. Pregnancy, Childbirth and the Newborn book given. Encouraged well-balanced diet, plenty of rest when needed, pre-natal vitamins daily and walking for exercise. Discussed self-help for nausea, avoiding OTC medications until consulting provider or pharmacist, other than Tylenol as needed, minimal caffeine (1-2 cups daily) and avoiding alcohol. She will schedule u/s for dating in 3 wks then her initial OB nurse visit visit @ 10 wks. Feel free to call with any questions. Samples of PNV given and sample of Bonjesta given with instructions on use.   Sandy Jenkins, CNM

## 2017-10-25 ENCOUNTER — Ambulatory Visit (INDEPENDENT_AMBULATORY_CARE_PROVIDER_SITE_OTHER): Payer: Medicaid Other

## 2017-10-25 DIAGNOSIS — Z3A10 10 weeks gestation of pregnancy: Secondary | ICD-10-CM | POA: Diagnosis not present

## 2017-10-25 DIAGNOSIS — N926 Irregular menstruation, unspecified: Secondary | ICD-10-CM

## 2017-10-25 DIAGNOSIS — Z3687 Encounter for antenatal screening for uncertain dates: Secondary | ICD-10-CM

## 2017-11-19 ENCOUNTER — Ambulatory Visit (INDEPENDENT_AMBULATORY_CARE_PROVIDER_SITE_OTHER): Payer: Medicaid Other | Admitting: Certified Nurse Midwife

## 2017-11-19 VITALS — BP 99/64 | HR 86 | Ht 63.0 in | Wt 180.6 lb

## 2017-11-19 DIAGNOSIS — O219 Vomiting of pregnancy, unspecified: Secondary | ICD-10-CM

## 2017-11-19 DIAGNOSIS — R638 Other symptoms and signs concerning food and fluid intake: Secondary | ICD-10-CM

## 2017-11-19 DIAGNOSIS — Z3482 Encounter for supervision of other normal pregnancy, second trimester: Secondary | ICD-10-CM

## 2017-11-19 DIAGNOSIS — Z202 Contact with and (suspected) exposure to infections with a predominantly sexual mode of transmission: Secondary | ICD-10-CM

## 2017-11-19 MED ORDER — CITRANATAL B-CALM 20-1 MG & 2 X 25 MG PO MISC: 1.0000 mg | Freq: Every day | ORAL | 11 refills | Status: DC

## 2017-11-19 NOTE — Progress Notes (Signed)
Sandy Jenkins presents for NOB nurse interview visit. Pregnancy confirmation done 10/04/2017 with AT.  G3- .  P1011-.   10/25/2017 dating scan shows SIUP at 10 1/7 EDD 05/22/2018.  Pregnancy education material explained and given. __0_ cats in the home. NOB labs ordered. TSH/HbgA1c ordered  due to Increased BMI-Body mass index is 31.99 kg/m.   HIV labs and Drug screen were explained and ordered.  PNV encouraged. Per pt request refilled. Mild nausea. No meds needed at this time.Needs tdap at 28 weeks. Last pap- 08/05/2016 neg. FMLA from signed. Financial policy reviewed. Genetic screening options discussed. Genetic testing: Declined.  Pt may discuss with provider. Pt. To follow up with provider in _1_ weeks for NOB physical.  All questions answered.   PT NEEDS TO LIE DOWN FOR B/W

## 2017-11-19 NOTE — Patient Instructions (Signed)
WHAT OB PATIENTS CAN EXPECT   Confirmation of pregnancy and ultrasound ordered if medically indicated-[redacted] weeks gestation  New OB (NOB) intake with nurse and New OB (NOB) labs- [redacted] weeks gestation  New OB (NOB) physical examination with provider- 11/[redacted] weeks gestation  Flu vaccine-[redacted] weeks gestation  Anatomy scan-[redacted] weeks gestation  Glucose tolerance test, blood work to test for anemia, T-dap vaccine-[redacted] weeks gestation  Vaginal swabs/cultures-STD/Group B strep-[redacted] weeks gestation  Appointments every 4 weeks until 28 weeks  Every 2 weeks from 28 weeks until 36 weeks  Weekly visits from 36 weeks until delivery  Second Trimester of Pregnancy The second trimester is from week 13 through week 28, month 4 through 6. This is often the time in pregnancy that you feel your best. Often times, morning sickness has lessened or quit. You may have more energy, and you may get hungry more often. Your unborn baby (fetus) is growing rapidly. At the end of the sixth month, he or she is about 9 inches long and weighs about 1 pounds. You will likely feel the baby move (quickening) between 18 and 20 weeks of pregnancy. Follow these instructions at home:  Avoid all smoking, herbs, and alcohol. Avoid drugs not approved by your doctor.  Do not use any tobacco products, including cigarettes, chewing tobacco, and electronic cigarettes. If you need help quitting, ask your doctor. You may get counseling or other support to help you quit.  Only take medicine as told by your doctor. Some medicines are safe and some are not during pregnancy.  Exercise only as told by your doctor. Stop exercising if you start having cramps.  Eat regular, healthy meals.  Wear a good support bra if your breasts are tender.  Do not use hot tubs, steam rooms, or saunas.  Wear your seat belt when driving.  Avoid raw meat, uncooked cheese, and liter boxes and soil used by cats.  Take your prenatal vitamins.  Take 1500-2000  milligrams of calcium daily starting at the 20th week of pregnancy until you deliver your baby.  Try taking medicine that helps you poop (stool softener) as needed, and if your doctor approves. Eat more fiber by eating fresh fruit, vegetables, and whole grains. Drink enough fluids to keep your pee (urine) clear or pale yellow.  Take warm water baths (sitz baths) to soothe pain or discomfort caused by hemorrhoids. Use hemorrhoid cream if your doctor approves.  If you have puffy, bulging veins (varicose veins), wear support hose. Raise (elevate) your feet for 15 minutes, 3-4 times a day. Limit salt in your diet.  Avoid heavy lifting, wear low heals, and sit up straight.  Rest with your legs raised if you have leg cramps or low back pain.  Visit your dentist if you have not gone during your pregnancy. Use a soft toothbrush to brush your teeth. Be gentle when you floss.  You can have sex (intercourse) unless your doctor tells you not to.  Go to your doctor visits. Get help if:  You feel dizzy.  You have mild cramps or pressure in your lower belly (abdomen).  You have a nagging pain in your belly area.  You continue to feel sick to your stomach (nauseous), throw up (vomit), or have watery poop (diarrhea).  You have bad smelling fluid coming from your vagina.  You have pain with peeing (urination). Get help right away if:  You have a fever.  You are leaking fluid from your vagina.  You have spotting or bleeding  from your vagina.  You have severe belly cramping or pain.  You lose or gain weight rapidly.  You have trouble catching your breath and have chest pain.  You notice sudden or extreme puffiness (swelling) of your face, hands, ankles, feet, or legs.  You have not felt the baby move in over an hour.  You have severe headaches that do not go away with medicine.  You have vision changes. This information is not intended to replace advice given to you by your health care  provider. Make sure you discuss any questions you have with your health care provider. Document Released: 06/24/2009 Document Revised: 09/05/2015 Document Reviewed: 05/31/2012 Elsevier Interactive Patient Education  2017 [employer] Morning Sickness Morning sickness is when you feel sick to your stomach (nauseous) during pregnancy. You may feel sick to your stomach and throw up (vomit). You may feel sick in the morning, but you can feel this way any time of day. Some women feel very sick to their stomach and cannot stop throwing up (hyperemesis gravidarum). Follow these instructions at home:  Only take medicines as told by your doctor.  Take multivitamins as told by your doctor. Taking multivitamins before getting pregnant can stop or lessen the harshness of morning sickness.  Eat dry toast or unsalted crackers before getting out of bed.  Eat 5 to 6 small meals a day.  Eat dry and bland foods like rice and baked potatoes.  Do not drink liquids with meals. Drink between meals.  Do not eat greasy, fatty, or spicy foods.  Have someone cook for you if the smell of food causes you to feel sick or throw up.  If you feel sick to your stomach after taking prenatal vitamins, take them at night or with a snack.  Eat protein when you need a snack (nuts, yogurt, cheese).  Eat unsweetened gelatins for dessert.  Wear a bracelet used for sea sickness (acupressure wristband).  Go to a doctor that puts thin needles into certain body points (acupuncture) to improve how you feel.  Do not smoke.  Use a humidifier to keep the air in your house free of odors.  Get lots of fresh air. Contact a doctor if:  You need medicine to feel better.  You feel dizzy or lightheaded.  You are losing weight. Get help right away if:  You feel very sick to your stomach and cannot stop throwing up.  You pass out (faint). This information is not intended to replace advice given to you by your health care  provider. Make sure you discuss any questions you have with your health care provider. Document Released: 05/07/2004 Document Revised: 09/05/2015 Document Reviewed: 09/14/2012 Elsevier Interactive Patient Education  2017 [place] of Pregnancy The first trimester of pregnancy is from week 1 until the end of week 13 (months 1 through 3). A week after a sperm fertilizes an egg, the egg will implant on the wall of the uterus. This embryo will begin to develop into a baby. Genes from you and your partner will form the baby. The female genes will determine whether the baby will be a boy or a girl. At 6-8 weeks, the eyes and face will be formed, and the heartbeat can be seen on ultrasound. At the end of 12 weeks, all the baby's organs will be formed. Now that you are pregnant, you will want to do everything you can to have a healthy baby. Two of the most important things are to get  good prenatal care and to follow your health care provider's instructions. Prenatal care is all the medical care you receive before the baby's birth. This care will help prevent, find, and treat any problems during the pregnancy and childbirth. Body changes during your first trimester Your body goes through many changes during pregnancy. The changes vary from woman to woman.  You may gain or lose a couple of pounds at first.  You may feel sick to your stomach (nauseous) and you may throw up (vomit). If the vomiting is uncontrollable, call your health care provider.  You may tire easily.  You may develop headaches that can be relieved by medicines. All medicines should be approved by your health care provider.  You may urinate more often. Painful urination may mean you have a bladder infection.  You may develop heartburn as a result of your pregnancy.  You may develop constipation because certain hormones are causing the muscles that push stool through your intestines to slow down.  You may develop  hemorrhoids or swollen veins (varicose veins).  Your breasts may begin to grow larger and become tender. Your nipples may stick out more, and the tissue that surrounds them (areola) may become darker.  Your gums may bleed and may be sensitive to brushing and flossing.  Dark spots or blotches (chloasma, mask of pregnancy) may develop on your face. This will likely fade after the baby is born.  Your menstrual periods will stop.  You may have a loss of appetite.  You may develop cravings for certain kinds of food.  You may have changes in your emotions from day to day, such as being excited to be pregnant or being concerned that something may go wrong with the pregnancy and baby.  You may have more vivid and strange dreams.  You may have changes in your hair. These can include thickening of your hair, rapid growth, and changes in texture. Some women also have hair loss during or after pregnancy, or hair that feels dry or thin. Your hair will most likely return to normal after your baby is born.  What to expect at prenatal visits During a routine prenatal visit:  You will be weighed to make sure you and the baby are growing normally.  Your blood pressure will be taken.  Your abdomen will be measured to track your baby's growth.  The fetal heartbeat will be listened to between weeks 10 and 14 of your pregnancy.  Test results from any previous visits will be discussed.  Your health care provider may ask you:  How you are feeling.  If you are feeling the baby move.  If you have had any abnormal symptoms, such as leaking fluid, bleeding, severe headaches, or abdominal cramping.  If you are using any tobacco products, including cigarettes, chewing tobacco, and electronic cigarettes.  If you have any questions.  Other tests that may be performed during your first trimester include:  Blood tests to find your blood type and to check for the presence of any previous infections. The  tests will also be used to check for low iron levels (anemia) and protein on red blood cells (Rh antibodies). Depending on your risk factors, or if you previously had diabetes during pregnancy, you may have tests to check for high blood sugar that affects pregnant women (gestational diabetes).  Urine tests to check for infections, diabetes, or protein in the urine.  An ultrasound to confirm the proper growth and development of the baby.  Fetal  screens for spinal cord problems (spina bifida) and Down syndrome.  HIV (human immunodeficiency virus) testing. Routine prenatal testing includes screening for HIV, unless you choose not to have this test.  You may need other tests to make sure you and the baby are doing well.  Follow these instructions at home: Medicines  Follow your health care provider's instructions regarding medicine use. Specific medicines may be either safe or unsafe to take during pregnancy.  Take a prenatal vitamin that contains at least 600 micrograms (mcg) of folic acid.  If you develop constipation, try taking a stool softener if your health care provider approves. Eating and drinking  Eat a balanced diet that includes fresh fruits and vegetables, whole grains, good sources of protein such as meat, eggs, or tofu, and low-fat dairy. Your health care provider will help you determine the amount of weight gain that is right for you.  Avoid raw meat and uncooked cheese. These carry germs that can cause birth defects in the baby.  Eating four or five small meals rather than three large meals a day may help relieve nausea and vomiting. If you start to feel nauseous, eating a few soda crackers can be helpful. Drinking liquids between meals, instead of during meals, also seems to help ease nausea and vomiting.  Limit foods that are high in fat and processed sugars, such as fried and sweet foods.  To prevent constipation: ? Eat foods that are high in fiber, such as fresh fruits  and vegetables, whole grains, and beans. ? Drink enough fluid to keep your urine clear or pale yellow. Activity  Exercise only as directed by your health care provider. Most women can continue their usual exercise routine during pregnancy. Try to exercise for 30 minutes at least 5 days a week. Exercising will help you: ? Control your weight. ? Stay in shape. ? Be prepared for labor and delivery.  Experiencing pain or cramping in the lower abdomen or lower back is a good sign that you should stop exercising. Check with your health care provider before continuing with normal exercises.  Try to avoid standing for long periods of time. Move your legs often if you must stand in one place for a long time.  Avoid heavy lifting.  Wear low-heeled shoes and practice good posture.  You may continue to have sex unless your health care provider tells you not to. Relieving pain and discomfort  Wear a good support bra to relieve breast tenderness.  Take warm sitz baths to soothe any pain or discomfort caused by hemorrhoids. Use hemorrhoid cream if your health care provider approves.  Rest with your legs elevated if you have leg cramps or low back pain.  If you develop varicose veins in your legs, wear support hose. Elevate your feet for 15 minutes, 3-4 times a day. Limit salt in your diet. Prenatal care  Schedule your prenatal visits by the twelfth week of pregnancy. They are usually scheduled monthly at first, then more often in the last 2 months before delivery.  Write down your questions. Take them to your prenatal visits.  Keep all your prenatal visits as told by your health care provider. This is important. Safety  Wear your seat belt at all times when driving.  Make a list of emergency phone numbers, including numbers for family, friends, the hospital, and police and fire departments. General instructions  Ask your health care provider for a referral to a local prenatal education  class. Begin classes no later  than the beginning of month 6 of your pregnancy.  Ask for help if you have counseling or nutritional needs during pregnancy. Your health care provider can offer advice or refer you to specialists for help with various needs.  Do not use hot tubs, steam rooms, or saunas.  Do not douche or use tampons or scented sanitary pads.  Do not cross your legs for long periods of time.  Avoid cat litter boxes and soil used by cats. These carry germs that can cause birth defects in the baby and possibly loss of the fetus by miscarriage or stillbirth.  Avoid all smoking, herbs, alcohol, and medicines not prescribed by your health care provider. Chemicals in these products affect the formation and growth of the baby.  Do not use any products that contain nicotine or tobacco, such as cigarettes and e-cigarettes. If you need help quitting, ask your health care provider. You may receive counseling support and other resources to help you quit.  Schedule a dentist appointment. At home, brush your teeth with a soft toothbrush and be gentle when you floss. Contact a health care provider if:  You have dizziness.  You have mild pelvic cramps, pelvic pressure, or nagging pain in the abdominal area.  You have persistent nausea, vomiting, or diarrhea.  You have a bad smelling vaginal discharge.  You have pain when you urinate.  You notice increased swelling in your face, hands, legs, or ankles.  You are exposed to fifth disease or chickenpox.  You are exposed to [place] measles (rubella) and have never had it. Get help right away if:  You have a fever.  You are leaking fluid from your vagina.  You have spotting or bleeding from your vagina.  You have severe abdominal cramping or pain.  You have rapid weight gain or loss.  You vomit blood or material that looks like coffee grounds.  You develop a severe headache.  You have shortness of breath.  You have any kind  of trauma, such as from a fall or a car accident. Summary  The first trimester of pregnancy is from week 1 until the end of week 13 (months 1 through 3).  Your body goes through many changes during pregnancy. The changes vary from woman to woman.  You will have routine prenatal visits. During those visits, your health care provider will examine you, discuss any test results you may have, and talk with you about how you are feeling. This information is not intended to replace advice given to you by your health care provider. Make sure you discuss any questions you have with your health care provider. Document Released: 03/24/2001 Document Revised: 03/11/2016 Document Reviewed: 03/11/2016 Elsevier Interactive Patient Education  2018 [employer]. Common Medications Safe in Pregnancy  Acne:      Constipation:  Benzoyl Peroxide     Colace  Clindamycin      Dulcolax Suppository  Topica Erythromycin     Fibercon  Salicylic Acid      Metamucil         Miralax AVOID:        Senakot   Accutane    Cough:  Retin-A       Cough Drops  Tetracycline      Phenergan w/ Codeine if Rx  Minocycline      Robitussin (Plain & DM)  Antibiotics:     Crabs/Lice:  Ceclor       RID  Cephalosporins    AVOID:  E-Mycins  Kwell  Keflex  Macrobid/Macrodantin   Diarrhea:  Penicillin      Kao-Pectate  Zithromax      Imodium AD         PUSH FLUIDS AVOID:       Cipro     Fever:  Tetracycline      Tylenol (Regular or Extra  Minocycline       Strength)  Levaquin      Extra Strength-Do not          Exceed 8 tabs/24 hrs Caffeine:        <262m/day (equiv. To 1 cup of coffee or  approx. 3 12 oz sodas)         Gas: Cold/Hayfever:       Gas-X  Benadryl      Mylicon  Claritin       Phazyme  **Claritin-D        Chlor-Trimeton    Headaches:  Dimetapp      ASA-Free Excedrin  Drixoral-Non-Drowsy     Cold Compress  Mucinex (Guaifenasin)     Tylenol (Regular or Extra  Sudafed/Sudafed-12  Hour     Strength)  **Sudafed PE Pseudoephedrine   Tylenol Cold & Sinus     Vicks Vapor Rub  Zyrtec  **AVOID if Problems With Blood Pressure         Heartburn: Avoid lying down for at least 1 hour after meals  Aciphex      Maalox     Rash:  Milk of Magnesia     Benadryl    Mylanta       1% Hydrocortisone Cream  Pepcid  Pepcid Complete   Sleep Aids:  Prevacid      Ambien   Prilosec       Benadryl  Rolaids       Chamomile Tea  Tums (Limit 4/day)     Unisom  Zantac       Tylenol PM         Warm milk-add vanilla or  Hemorrhoids:       Sugar for taste  Anusol/Anusol H.C.  (RX: Analapram 2.5%)  Sugar Substitutes:  Hydrocortisone OTC     Ok in moderation  Preparation H      Tucks        Vaseline lotion applied to tissue with wiping    Herpes:     Throat:  Acyclovir      Oragel  Famvir  Valtrex     Vaccines:         Flu Shot Leg Cramps:       *Gardasil  Benadryl      Hepatitis A         Hepatitis B Nasal Spray:       Pneumovax  Saline Nasal Spray     Polio Booster         Tetanus Nausea:       Tuberculosis test or PPD  Vitamin B6 25 mg TID   AVOID:    Dramamine      *Gardasil  Emetrol       Live Poliovirus  Ginger Root 250 mg QID    MMR (measles, mumps &  High Complex Carbs @ Bedtime    rebella)  Sea Bands-Accupressure    Varicella (Chickenpox)  Unisom 1/2 tab TID     *No known complications           If received before Pain:         Known pregnancy;  Darvocet       Resume series after  Lortab        Delivery  Percocet    Yeast:   Tramadol      Femstat  Tylenol 3      Gyne-lotrimin  Ultram       Monistat  Vicodin           MISC:         All Sunscreens           Hair Coloring/highlights          Insect Repellant's          (Including DEET)         [facility]  [place], [place], [place] [address]  Phone: [phone]   [facility] (second location)  [address] [place] [address] [place], [place] [address]  Phone: [phone]   [facility]) [place], [place], [place] [address] Phone: [phone]   [facility] [place], [place] [address]  Phone: [phone]

## 2017-11-20 LAB — CBC WITH DIFFERENTIAL/PLATELET
Basophils Absolute: 0 10*3/uL (ref 0.0–0.2)
Basos: 1 %
EOS (ABSOLUTE): 0.5 10*3/uL — ABNORMAL HIGH (ref 0.0–0.4)
EOS: 7 %
HEMATOCRIT: 39.1 % (ref 34.0–46.6)
HEMOGLOBIN: 13 g/dL (ref 11.1–15.9)
Immature Grans (Abs): 0 10*3/uL (ref 0.0–0.1)
Immature Granulocytes: 0 %
LYMPHS ABS: 1.7 10*3/uL (ref 0.7–3.1)
Lymphs: 23 %
MCH: 31.6 pg (ref 26.6–33.0)
MCHC: 33.2 g/dL (ref 31.5–35.7)
MCV: 95 fL (ref 79–97)
MONOCYTES: 6 %
Monocytes Absolute: 0.4 10*3/uL (ref 0.1–0.9)
Neutrophils Absolute: 4.6 10*3/uL (ref 1.4–7.0)
Neutrophils: 63 %
Platelets: 277 10*3/uL (ref 150–450)
RBC: 4.11 x10E6/uL (ref 3.77–5.28)
RDW: 14.5 % (ref 12.3–15.4)
WBC: 7.3 10*3/uL (ref 3.4–10.8)

## 2017-11-20 LAB — DRUG PROFILE, UR, 9 DRUGS (LABCORP)
Amphetamines, Urine: NEGATIVE ng/mL
BENZODIAZEPINE QUANT UR: NEGATIVE ng/mL
Barbiturate Quant, Ur: NEGATIVE ng/mL
CANNABINOID QUANT UR: NEGATIVE ng/mL
Cocaine (Metab.): NEGATIVE ng/mL
Methadone Screen, Urine: NEGATIVE ng/mL
Opiate Quant, Ur: NEGATIVE ng/mL
PCP Quant, Ur: NEGATIVE ng/mL
Propoxyphene: NEGATIVE ng/mL

## 2017-11-20 LAB — MICROSCOPIC EXAMINATION: CASTS: NONE SEEN /LPF

## 2017-11-20 LAB — URINALYSIS, ROUTINE W REFLEX MICROSCOPIC
BILIRUBIN UA: NEGATIVE
GLUCOSE, UA: NEGATIVE
Ketones, UA: NEGATIVE
Nitrite, UA: NEGATIVE
PH UA: 7 (ref 5.0–7.5)
PROTEIN UA: NEGATIVE
RBC UA: NEGATIVE
Specific Gravity, UA: 1.014 (ref 1.005–1.030)
Urobilinogen, Ur: 0.2 mg/dL (ref 0.2–1.0)

## 2017-11-20 LAB — NICOTINE SCREEN, URINE: Cotinine Ql Scrn, Ur: POSITIVE ng/mL — AB

## 2017-11-20 LAB — ABO AND RH: Rh Factor: POSITIVE

## 2017-11-20 LAB — ANTIBODY SCREEN: ANTIBODY SCREEN: NEGATIVE

## 2017-11-20 LAB — HEPATITIS B SURFACE ANTIGEN: HEP B S AG: NEGATIVE

## 2017-11-20 LAB — TSH: TSH: 1.77 u[IU]/mL (ref 0.450–4.500)

## 2017-11-20 LAB — VARICELLA ZOSTER ANTIBODY, IGG: VARICELLA: 1212 {index} (ref >165)

## 2017-11-20 LAB — HEMOGLOBIN A1C
Est. average glucose Bld gHb Est-mCnc: 100 mg/dL
Hgb A1c MFr Bld: 5.1 % (ref 4.8–5.6)

## 2017-11-20 LAB — HIV ANTIBODY (ROUTINE TESTING W REFLEX): HIV Screen 4th Generation wRfx: NONREACTIVE

## 2017-11-20 LAB — RPR: RPR: NONREACTIVE

## 2017-11-20 LAB — RUBELLA SCREEN

## 2017-11-22 ENCOUNTER — Encounter: Payer: Self-pay | Admitting: Certified Nurse Midwife

## 2017-11-22 ENCOUNTER — Telehealth: Payer: Self-pay

## 2017-11-22 ENCOUNTER — Ambulatory Visit (INDEPENDENT_AMBULATORY_CARE_PROVIDER_SITE_OTHER): Payer: Medicaid Other | Admitting: Certified Nurse Midwife

## 2017-11-22 VITALS — BP 86/56 | HR 91 | Wt 180.1 lb

## 2017-11-22 DIAGNOSIS — Z3A14 14 weeks gestation of pregnancy: Secondary | ICD-10-CM | POA: Diagnosis not present

## 2017-11-22 DIAGNOSIS — Z3481 Encounter for supervision of other normal pregnancy, first trimester: Secondary | ICD-10-CM | POA: Diagnosis not present

## 2017-11-22 LAB — POCT URINALYSIS DIPSTICK
BILIRUBIN UA: NEGATIVE
GLUCOSE UA: NEGATIVE
KETONES UA: NEGATIVE
Leukocytes, UA: NEGATIVE
Nitrite, UA: NEGATIVE
Protein, UA: NEGATIVE
RBC UA: NEGATIVE
SPEC GRAV UA: 1.02 (ref 1.010–1.025)
Urobilinogen, UA: 0.2 E.U./dL
pH, UA: 6.5 (ref 5.0–8.0)

## 2017-11-22 MED ORDER — CITRANATAL 90 DHA 90-1 & 300 MG PO MISC: 1.0000 | Freq: Every day | ORAL | 11 refills | Status: AC

## 2017-11-22 NOTE — Progress Notes (Signed)
NEW OB HISTORY AND PHYSICAL  SUBJECTIVE:       Sandy Jenkins is a 27 y.o. [id number] female, Patient's last menstrual period was 08/24/2017 (exact date)., Estimated Date of Delivery: 05/22/18, 1927w1d, presents today for establishment of Prenatal Care. She has no unusual complaints    Gynecologic History Patient's last menstrual period was 08/24/2017 (exact date). Normal Contraception: none Last Pap: 08/05/16 Results were: normal  Obstetric History OB History  Gravida Para Term Preterm AB Living  3 1 1   1 1   SAB TAB Ectopic Multiple Live Births  1     0 1    # Outcome Date GA Lbr Len/2nd Weight Sex Delivery Anes PTL Lv  3 Current           2 Term 02/09/17 2838w1d  8 lb (3.63 kg) M Vag-Spont EPI    1 SAB             Past Medical History:  Diagnosis Date  . Gestational diabetes   . History of miscarriage     History reviewed. No pertinent surgical history.  Current Outpatient Medications on File Prior to Visit  Medication Sig Dispense Refill  . Prenat w/o A FeCbnFeGlu-FA &B6 (CITRANATAL B-CALM) 20-1 MG & 2 x 25 MG MISC Take 1 mg by mouth daily. 30 each 11   No current facility-administered medications on file prior to visit.     Allergies  Allergen Reactions  . Penicillins     Social History   Socioeconomic History  . Marital status: Single    Spouse name: Not on file  . Number of children: Not on file  . Years of education: Not on file  . Highest education level: Not on file  Occupational History  . Not on file  Social Needs  . Financial resource strain: Not on file  . Food insecurity:    Worry: Not on file    Inability: Not on file  . Transportation needs:    Medical: Not on file    Non-medical: Not on file  Tobacco Use  . Smoking status: Current Every Day Smoker    Packs/day: 0.50    Years: 8.00    Pack years: 4.00    Types: Cigarettes  . Smokeless tobacco: Never Used  Substance and Sexual Activity  . Alcohol use: No  . Drug use: No  . Sexual  activity: Yes    Birth control/protection: None  Lifestyle  . Physical activity:    Days per week: Not on file    Minutes per session: Not on file  . Stress: Not on file  Relationships  . Social connections:    Talks on phone: Not on file    Gets together: Not on file    Attends religious service: Not on file    Active member of club or organization: Not on file    Attends meetings of clubs or organizations: Not on file    Relationship status: Not on file  . Intimate partner violence:    Fear of current or ex partner: Not on file    Emotionally abused: Not on file    Physically abused: Not on file    Forced sexual activity: Not on file  Other Topics Concern  . Not on file  Social History Narrative  . Not on file   Smoker 7-8 per day. Information given of smoking in pregnancy. Pt encouraged to quit.    Family History  Problem Relation Age of Onset  .  Diabetes Paternal Grandfather   . Breast cancer Neg Hx   . Ovarian cancer Neg Hx   . Colon cancer Neg Hx     The following portions of the patient's history were reviewed and updated as appropriate: allergies, current medications, past OB history, past medical history, past surgical history, past family history, past social history, and problem list.    OBJECTIVE: Initial Physical Exam (New OB)  GENERAL APPEARANCE: alert, well appearing, in no apparent distress, overweight HEAD: normocephalic, atraumatic MOUTH: mucous membranes moist, pharynx normal without lesions THYROID: no thyromegaly or masses present BREASTS: no masses noted, no significant tenderness, no palpable axillary nodes, no skin changes LUNGS: clear to auscultation, no wheezes, rales or rhonchi, symmetric air entry HEART: regular rate and rhythm, no murmurs ABDOMEN: soft, nontender, nondistended, no abnormal masses, no epigastric pain and obese EXTREMITIES: no redness or tenderness in the calves or thighs SKIN: normal coloration and turgor, no  rashes LYMPH NODES: no adenopathy palpable NEUROLOGIC: alert, oriented, normal speech, no focal findings or movement disorder noted  PELVIC EXAM EXTERNAL GENITALIA: normal appearing vulva with no masses, tenderness or lesions VAGINA: no abnormal discharge or lesions CERVIX: no lesions or cervical motion tenderness UTERUS: gravid ADNEXA: no masses palpable and nontender OB EXAM PELVIMETRY: appears adequate RECTUM: exam not indicated  ASSESSMENT: Normal pregnancy  PLAN: New OB counseling: The patient has been given an overview regarding routine prenatal care. Recommendations regarding diet, weight gain, and exercise in pregnancy were given. Prenatal testing, optional genetic testing, and ultrasound use in pregnancy were reviewed. Declines genetic testing. Benefits of Breast Feeding were discussed. The patient is encouraged to consider nursing her baby post partum. She states she was unable to nurse previously and will bottle feed this baby as well.   Doreene BurkeAnnie Jonea Bukowski, CNM

## 2017-11-22 NOTE — Telephone Encounter (Signed)
Left message on pts voicemail re AT instructions- Please come in at 915 on 12/20/17 for early 1 hour gtt.

## 2017-11-22 NOTE — Addendum Note (Signed)
Addended by: Mechele ClaudeHOMPSON, Randee Upchurch M on: 11/22/2017 10:02 AM   Modules accepted: Orders

## 2017-11-22 NOTE — Addendum Note (Signed)
Addended by: Brooke DareSICK, Ajooni Karam L on: 11/22/2017 10:13 AM   Modules accepted: Orders

## 2017-11-22 NOTE — Addendum Note (Signed)
Addended by: Brooke DareSICK, Messi Twedt L on: 11/22/2017 10:03 AM   Modules accepted: Orders

## 2017-11-22 NOTE — Patient Instructions (Addendum)
Prenatal Care WHAT IS PRENATAL CARE? Prenatal care is the process of caring for a pregnant woman before she gives birth. Prenatal care makes sure that she and her baby remain as healthy as possible throughout pregnancy. Prenatal care may be provided by a midwife, family practice health care provider, or a childbirth and pregnancy specialist (obstetrician). Prenatal care may include physical examinations, testing, treatments, and education on nutrition, lifestyle, and social support services. WHY IS PRENATAL CARE SO IMPORTANT? Early and consistent prenatal care increases the chance that you and your baby will remain healthy throughout your pregnancy. This type of care also decreases a baby's risk of being born too early (prematurely), or being born smaller than expected (small for gestational age). Any underlying medical conditions you may have that could pose a risk during your pregnancy are discussed during prenatal care visits. You will also be monitored regularly for any new conditions that may arise during your pregnancy so they can be treated quickly and effectively. WHAT HAPPENS DURING PRENATAL CARE VISITS? Prenatal care visits may include the following: Discussion Tell your health care provider about any new signs or symptoms you have experienced since your last visit. These might include:  Nausea or vomiting.  Increased or decreased level of energy.  Difficulty sleeping.  Back or leg pain.  Weight changes.  Frequent urination.  Shortness of breath with physical activity.  Changes in your skin, such as the development of a rash or itchiness.  Vaginal discharge or bleeding.  Feelings of excitement or nervousness.  Changes in your baby's movements.  You may want to write down any questions or topics you want to discuss with your health care provider and bring them with you to your appointment. Examination During your first prenatal care visit, you will likely have a complete  physical exam. Your health care provider will often examine your vagina, cervix, and the position of your uterus, as well as check your heart, lungs, and other body systems. As your pregnancy progresses, your health care provider will measure the size of your uterus and your baby's position inside your uterus. He or she may also examine you for early signs of labor. Your prenatal visits may also include checking your blood pressure and, after about 10-12 weeks of pregnancy, listening to your baby's heartbeat. Testing Regular testing often includes:  Urinalysis. This checks your urine for glucose, protein, or signs of infection.  Blood count. This checks the levels of white and red blood cells in your body.  Tests for sexually transmitted infections (STIs). Testing for STIs at the beginning of pregnancy is routinely done and is required in many states.  Antibody testing. You will be checked to see if you are immune to certain illnesses, such as rubella, that can affect a developing fetus.  Glucose screen. Around 24-28 weeks of pregnancy, your blood glucose level will be checked for signs of gestational diabetes. Follow-up tests may be recommended.  Group B strep. This is a bacteria that is commonly found inside a woman's vagina. This test will inform your health care provider if you need an antibiotic to reduce the amount of this bacteria in your body prior to labor and childbirth.  Ultrasound. Many pregnant women undergo an ultrasound screening around 18-20 weeks of pregnancy to evaluate the health of the fetus and check for any developmental abnormalities.  HIV (human immunodeficiency virus) testing. Early in your pregnancy, you will be screened for HIV. If you are at high risk for HIV, this test may   be repeated during your third trimester of pregnancy.  You may be offered other testing based on your age, personal or family medical history, or other factors. HOW OFTEN SHOULD I PLAN TO SEE MY  HEALTH CARE PROVIDER FOR PRENATAL CARE? Your prenatal care check-up schedule depends on any medical conditions you have before, or develop during, your pregnancy. If you do not have any underlying medical conditions, you will likely be seen for checkups:  Monthly, during the first 6 months of pregnancy.  Twice a month during months 7 and 8 of pregnancy.  Weekly starting in the 9th month of pregnancy and until delivery.  If you develop signs of early labor or other concerning signs or symptoms, you may need to see your health care provider more often. Ask your health care provider what prenatal care schedule is best for you. WHAT CAN I DO TO KEEP MYSELF AND MY BABY AS HEALTHY AS POSSIBLE DURING MY PREGNANCY?  Take a prenatal vitamin containing 400 micrograms (0.4 mg) of folic acid every day. Your health care provider may also ask you to take additional vitamins such as iodine, vitamin D, iron, copper, and zinc.  Take 1500-2000 mg of calcium daily starting at your 20th week of pregnancy until you deliver your baby.  Make sure you are up to date on your vaccinations. Unless directed otherwise by your health care provider: ? You should receive a tetanus, diphtheria, and pertussis (Tdap) vaccination between the 27th and 36th week of your pregnancy, regardless of when your last Tdap immunization occurred. This helps protect your baby from whooping cough (pertussis) after he or she is born. ? You should receive an annual inactivated influenza vaccine (IIV) to help protect you and your baby from influenza. This can be done at any point during your pregnancy.  Eat a well-rounded diet that includes: ? Fresh fruits and vegetables. ? Lean proteins. ? Calcium-rich foods such as milk, yogurt, hard cheeses, and dark, leafy greens. ? Whole grain breads.  Do noteat seafood high in mercury, including: ? Swordfish. ? Tilefish. ? Shark. ? King mackerel. ? More than 6 oz tuna per week.  Do not  eat: ? Raw or undercooked meats or eggs. ? Unpasteurized foods, such as soft cheeses (brie, blue, or feta), juices, and milks. ? Lunch meats. ? Hot dogs that have not been heated until they are steaming.  Drink enough water to keep your urine clear or pale yellow. For many women, this may be 10 or more 8 oz glasses of water each day. Keeping yourself hydrated helps deliver nutrients to your baby and may prevent the start of pre-term uterine contractions.  Do not use any tobacco products including cigarettes, chewing tobacco, or electronic cigarettes. If you need help quitting, ask your health care provider.  Do not drink beverages containing alcohol. No safe level of alcohol consumption during pregnancy has been determined.  Do not use any illegal drugs. These can harm your developing baby or cause a miscarriage.  Ask your health care provider or pharmacist before taking any prescription or over-the-counter medicines, herbs, or supplements.  Limit your caffeine intake to no more than 200 mg per day.  Exercise. Unless told otherwise by your health care provider, try to get 30 minutes of moderate exercise most days of the week. Do not  do high-impact activities, contact sports, or activities with a high risk of falling, such as horseback riding or downhill skiing.  Get plenty of rest.  Avoid anything that raises your  body temperature, such as hot tubs and saunas.  If you own a cat, do not empty its litter box. Bacteria contained in cat feces can cause an infection called toxoplasmosis. This can result in serious harm to the fetus.  Stay away from chemicals such as insecticides, lead, mercury, and cleaning or paint products that contain solvents.  Do not have any X-rays taken unless medically necessary.  Take a childbirth and breastfeeding preparation class. Ask your health care provider if you need a referral or recommendation.  This information is not intended to replace advice given  to you by your health care provider. Make sure you discuss any questions you have with your health care provider. Document Released: 04/02/2003 Document Revised: 09/02/2015 Document Reviewed: 06/14/2013 Elsevier Interactive Patient Education  2017 [employer]  Smoking During Pregnancy Smoking during pregnancy is unhealthy for you and your baby. Smoke from cigarettes, pipes, and cigars contains many chemicals that can cause cancer (carcinogens). Cigarettes also contain a stimulant drug (nicotine). When you smoke, harmful substances that you breathe in enter your bloodstream and can be passed on to your baby. This can affect your baby's development. If you are planning to become pregnant or have recently become pregnant, talk with your health care provider about quitting smoking. How does smoking affect me? Smoking increases your risk for many long-term (chronic) diseases. These diseases include cancer, lung diseases, and heart disease. Smoking during pregnancy increases your risk of:  Losing the pregnancy (miscarriage or stillbirth).  Giving birth too early (premature birth).  Pregnancy outside of the uterus (tubal pregnancy).  Having problems with the organ that provides the baby nourishment and oxygen (placenta), including: ? Attachment of the placenta over the opening of the uterus (placenta previa). ? Detachment of the placenta before the baby's birth (placental abruption).  Having your water break before labor begins (premature rupture of membranes).  How does smoking affect my baby? Before Birth Smoking during pregnancy:  Decreases blood flow and oxygen to your baby.  Increases your baby's risk of birth defects, such as heart defects.  Increases your baby's heart rate.  Slows your baby's growth in the uterus (intrauterine growth retardation).  After Birth Babies born to women who smoked during pregnancy may:  Have symptoms of nicotine withdrawal.  Need to stay in the  hospital for special care.  May be too small at birth.  Have a high risk of: ? Serious health problems or lifelong disabilities. ? Sudden infant death syndrome (SIDS). ? Becoming obese. ? Developing behavior or learning problems.  What can happen if changes are not made? When babies are born with a birth defect or illness, they often need to stay in the hospital longer before going home. Hospital stays may also be longer if you had any complications during labor or delivery. Longer hospital stays and more treatments result in higher costs for health care. Many health issues among babies born to mothers who smoke can have a lifelong impact. This may include the long-term need for certain medicines, therapies, or other treatments. What are the benefits of not smoking during pregnancy? You have a much better chance of having a healthy pregnancy and a healthy baby if you do not smoke while you are pregnant. Not smoking also means that you will have a better chance of living a long and healthy life, and your baby will have a better chance of growing into a healthy child and adult. What actions can be taken? Quitting smoking can be difficult. Ask  your health care provider for help to stop smoking. You may also consider:  Counseling to help you quit smoking (smoking cessation counseling).  Psychotherapy.  Acupuncture.  Hypnosis.  Telephone [employer] hotlines.  If these methods do not help you, talk with your health care provider about other options. Do not take smoking cessation medicines or nicotine supplements unless your health care provider tells you to. Where to find more information: Learn more about smoking during pregnancy and quitting smoking from:  March of [employer]imes: www.marchofdimes.org/pregnancy/smoking-during-pregnancy.aspx  [place].S. Department of Health and Human Services: women.smokefree.gov  American Cancer Society: www.cancer.org  American Heart Association:  www.heart.org  National Cancer Institute: www.cancer.gov  For help to quit smoking:  National smoking cessation telephone hotline: 1-800-QUIT NOW 909 867 1047(725-096-4754)  Contact a health care provider if:  You are struggling to quit smoking.  You are a smoker and you become pregnant or plan to become pregnant.  You start smoking again after giving birth. Summary  Tobacco smoke contains harmful substances that can affect a baby's health and development.  Smoking increases the risk for serious problems, such as miscarriage, birth defects, or premature birth.  If you need help to quit smoking, ask your health care provider. This information is not intended to replace advice given to you by your health care provider. Make sure you discuss any questions you have with your health care provider. Document Released: 08/11/2004 Document Revised: 01/17/2016 Document Reviewed: 01/17/2016 Elsevier Interactive Patient Education  2018 [employer]

## 2017-11-23 LAB — GC/CHLAMYDIA PROBE AMP
Chlamydia trachomatis, NAA: NEGATIVE
Neisseria gonorrhoeae by PCR: NEGATIVE

## 2017-11-24 LAB — URINE CULTURE, OB REFLEX

## 2017-11-24 LAB — CULTURE, OB URINE

## 2017-12-20 ENCOUNTER — Other Ambulatory Visit: Payer: Self-pay | Admitting: Certified Nurse Midwife

## 2017-12-20 ENCOUNTER — Other Ambulatory Visit: Payer: Medicaid Other

## 2017-12-20 ENCOUNTER — Ambulatory Visit (INDEPENDENT_AMBULATORY_CARE_PROVIDER_SITE_OTHER): Payer: Medicaid Other | Admitting: Certified Nurse Midwife

## 2017-12-20 VITALS — BP 110/83 | HR 91 | Wt 180.0 lb

## 2017-12-20 DIAGNOSIS — Z3689 Encounter for other specified antenatal screening: Secondary | ICD-10-CM

## 2017-12-20 DIAGNOSIS — Z3482 Encounter for supervision of other normal pregnancy, second trimester: Secondary | ICD-10-CM | POA: Diagnosis not present

## 2017-12-20 DIAGNOSIS — Z3481 Encounter for supervision of other normal pregnancy, first trimester: Secondary | ICD-10-CM

## 2017-12-20 LAB — POCT URINALYSIS DIPSTICK OB
BILIRUBIN UA: NEGATIVE
Glucose, UA: NEGATIVE
Ketones, UA: NEGATIVE
Nitrite, UA: NEGATIVE
PH UA: 7 (ref 5.0–8.0)
RBC UA: NEGATIVE
Spec Grav, UA: 1.01 (ref 1.010–1.025)
UROBILINOGEN UA: 0.2 U/dL

## 2017-12-20 NOTE — Progress Notes (Signed)
Pt presents today for ROB and early GTT. Pt states she has no concerns at this time.

## 2017-12-20 NOTE — Progress Notes (Signed)
ROB-Doing well, no questions or concerns. Early glucola today due to history of GDM in last pregnancy. Desires pills as postpartum contraception. Meeting with Jola Babinski, Pregnancy Case Manage, prior to today's visit. Anticipatory guidance regarding course of prenatal care. Reviewed red flag symptoms and when to call. RTC x 2 weeks for anatomy scan and ROB or sooner if needed.

## 2017-12-20 NOTE — Patient Instructions (Addendum)
Common Medications Safe in Pregnancy  Acne:      Constipation:  Benzoyl Peroxide     Colace  Clindamycin      Dulcolax Suppository  Topica Erythromycin     Fibercon  Salicylic Acid      Metamucil         Miralax AVOID:        Senakot   Accutane    Cough:  Retin-A       Cough Drops  Tetracycline      Phenergan w/ Codeine if Rx  Minocycline      Robitussin (Plain & DM)  Antibiotics:     Crabs/Lice:  Ceclor       RID  Cephalosporins    AVOID:  E-Mycins      Kwell  Keflex  Macrobid/Macrodantin   Diarrhea:  Penicillin      Kao-Pectate  Zithromax      Imodium AD         PUSH FLUIDS AVOID:       Cipro     Fever:  Tetracycline      Tylenol (Regular or Extra  Minocycline       Strength)  Levaquin      Extra Strength-Do not          Exceed 8 tabs/24 hrs Caffeine:        <200mg/day (equiv. To 1 cup of coffee or  approx. 3 12 oz sodas)         Gas: Cold/Hayfever:       Gas-X  Benadryl      Mylicon  Claritin       Phazyme  **Claritin-D        Chlor-Trimeton    Headaches:  Dimetapp      ASA-Free Excedrin  Drixoral-Non-Drowsy     Cold Compress  Mucinex (Guaifenasin)     Tylenol (Regular or Extra  Sudafed/Sudafed-12 Hour     Strength)  **Sudafed PE Pseudoephedrine   Tylenol Cold & Sinus     Vicks Vapor Rub  Zyrtec  **AVOID if Problems With Blood Pressure         Heartburn: Avoid lying down for at least 1 hour after meals  Aciphex      Maalox     Rash:  Milk of Magnesia     Benadryl    Mylanta       1% Hydrocortisone Cream  Pepcid  Pepcid Complete   Sleep Aids:  Prevacid      Ambien   Prilosec       Benadryl  Rolaids       Chamomile Tea  Tums (Limit 4/day)     Unisom  Zantac       Tylenol PM         Warm milk-add vanilla or  Hemorrhoids:       Sugar for taste  Anusol/Anusol H.C.  (RX: Analapram 2.5%)  Sugar Substitutes:  Hydrocortisone OTC     Ok in moderation  Preparation H      Tucks        Vaseline lotion applied to tissue with  wiping    Herpes:     Throat:  Acyclovir      Oragel  Famvir  Valtrex     Vaccines:         Flu Shot Leg Cramps:       *Gardasil  Benadryl      Hepatitis A         Hepatitis B Nasal Spray:         Pneumovax  Saline Nasal Spray     Polio Booster         Tetanus Nausea:       Tuberculosis test or PPD  Vitamin B6 25 mg TID   AVOID:    Dramamine      *Gardasil  Emetrol       Live Poliovirus  Ginger Root 250 mg QID    MMR (measles, mumps &  High Complex Carbs @ Bedtime    rebella)  Sea Bands-Accupressure    Varicella (Chickenpox)  Unisom 1/2 tab TID     *No known complications           If received before Pain:         Known pregnancy;   Darvocet       Resume series after  Lortab        Delivery  Percocet    Yeast:   Tramadol      Femstat  Tylenol 3      Gyne-lotrimin  Ultram       Monistat  Vicodin           MISC:         All Sunscreens           Hair Coloring/highlights          Insect Repellant's          (Including DEET)         Mystic Tans Back Pain in Pregnancy Back pain during pregnancy is common. Back pain may be caused by several factors that are related to changes during your pregnancy. Follow these instructions at home: Managing pain, stiffness, and swelling  If directed, apply ice for sudden (acute) back pain. ? Put ice in a plastic bag. ? Place a towel between your skin and the bag. ? Leave the ice on for 20 minutes, 2-3 times per day.  If directed, apply heat to the affected area before you exercise: ? Place a towel between your skin and the heat pack or heating pad. ? Leave the heat on for 20-30 minutes. ? Remove the heat if your skin turns bright red. This is especially important if you are unable to feel pain, heat, or cold. You may have a greater risk of getting burned. Activity  Exercise as told by your health care provider. Exercising is the best way to prevent or manage back pain.  Listen to your body when lifting. If lifting hurts, ask for help or  bend your knees. This uses your leg muscles instead of your back muscles.  Squat down when picking up something from the floor. Do not bend over.  Only use bed rest as told by your health care provider. Bed rest should only be used for the most severe episodes of back pain. Standing, Sitting, and Lying Down  Do not stand in one place for long periods of time.  Use good posture when sitting. Make sure your head rests over your shoulders and is not hanging forward. Use a pillow on your lower back if necessary.  Try sleeping on your side, preferably the left side, with a pillow or two between your legs. If you are sore after a night's rest, your bed may be too soft. A firm mattress may provide more support for your back during pregnancy. General instructions  Do not wear high heels.  Eat a healthy diet. Try to gain weight within your health care provider's recommendations.  Use a maternity girdle, elastic sling, or   back brace as told by your health care provider.  Take over-the-counter and prescription medicines only as told by your health care provider.  Keep all follow-up visits as told by your health care provider. This is important. This includes any visits with any specialists, such as a physical therapist. Contact a health care provider if:  Your back pain interferes with your daily activities.  You have increasing pain in other parts of your body. Get help right away if:  You develop numbness, tingling, weakness, or problems with the use of your arms or legs.  You develop severe back pain that is not controlled with medicine.  You have a sudden change in bowel or bladder control.  You develop shortness of breath, dizziness, or you faint.  You develop nausea, vomiting, or sweating.  You have back pain that is a rhythmic, cramping pain similar to labor pains. Labor pain is usually 1-2 minutes apart, lasts for about 1 minute, and involves a bearing down feeling or pressure in  your pelvis.  You have back pain and your water breaks or you have vaginal bleeding.  You have back pain or numbness that travels down your leg.  Your back pain developed after you fell.  You develop pain on one side of your back.  You see blood in your urine.  You develop skin blisters in the area of your back pain. This information is not intended to replace advice given to you by your health care provider. Make sure you discuss any questions you have with your health care provider. Document Released: 07/08/2005 Document Revised: 09/05/2015 Document Reviewed: 12/12/2014 Elsevier Interactive Patient Education  2018 [employer]. Round Ligament Pain The round ligament is a cord of muscle and tissue that helps to support the uterus. It can become a source of pain during pregnancy if it becomes stretched or twisted as the baby grows. The pain usually begins in the second trimester of pregnancy, and it can come and go until the baby is delivered. It is not a serious problem, and it does not cause harm to the baby. Round ligament pain is usually a short, sharp, and pinching pain, but it can also be a dull, lingering, and aching pain. The pain is felt in the lower side of the abdomen or in the groin. It usually starts deep in the groin and moves up to the outside of the hip area. Pain can occur with:  A sudden change in position.  Rolling over in bed.  Coughing or sneezing.  Physical activity.  Follow these instructions at home: Watch your condition for any changes. Take these steps to help with your pain:  When the pain starts, relax. Then try: ? Sitting down. ? Flexing your knees up to your abdomen. ? Lying on your side with one pillow under your abdomen and another pillow between your legs. ? Sitting in a warm bath for 15-20 minutes or until the pain goes away.  Take over-the-counter and prescription medicines only as told by your health care provider.  Move slowly when you sit  and stand.  Avoid long walks if they cause pain.  Stop or lessen your physical activities if they cause pain.  Contact a health care provider if:  Your pain does not go away with treatment.  You feel pain in your back that you did not have before.  Your medicine is not helping. Get help right away if:  You develop a fever or chills.  You develop uterine contractions.  You develop vaginal bleeding.  You develop nausea or vomiting.  You develop diarrhea.  You have pain when you urinate. This information is not intended to replace advice given to you by your health care provider. Make sure you discuss any questions you have with your health care provider. Document Released: 01/07/2008 Document Revised: 09/05/2015 Document Reviewed: 06/06/2014 Elsevier Interactive Patient Education  [employer].

## 2017-12-21 LAB — GLUCOSE TOLERANCE, 1 HOUR: Glucose, 1Hr PP: 117 mg/dL (ref 65–199)

## 2018-01-03 ENCOUNTER — Ambulatory Visit (INDEPENDENT_AMBULATORY_CARE_PROVIDER_SITE_OTHER): Payer: Medicaid Other | Admitting: Certified Nurse Midwife

## 2018-01-03 ENCOUNTER — Ambulatory Visit (INDEPENDENT_AMBULATORY_CARE_PROVIDER_SITE_OTHER): Payer: Medicaid Other

## 2018-01-03 VITALS — BP 111/66 | HR 88 | Wt 182.0 lb

## 2018-01-03 DIAGNOSIS — Z3482 Encounter for supervision of other normal pregnancy, second trimester: Secondary | ICD-10-CM | POA: Diagnosis not present

## 2018-01-03 DIAGNOSIS — Z3689 Encounter for other specified antenatal screening: Secondary | ICD-10-CM

## 2018-01-03 LAB — POCT URINALYSIS DIPSTICK OB
BILIRUBIN UA: NEGATIVE
Blood, UA: NEGATIVE
GLUCOSE, UA: NEGATIVE
Ketones, UA: NEGATIVE
LEUKOCYTES UA: NEGATIVE
Nitrite, UA: NEGATIVE
POC,PROTEIN,UA: NEGATIVE
Spec Grav, UA: 1.01 (ref 1.010–1.025)
Urobilinogen, UA: 0.2 E.U./dL
pH, UA: 7 (ref 5.0–8.0)

## 2018-01-03 NOTE — Progress Notes (Signed)
Sandy Jenkins, doing well. No complaints. Feels good movement. Anatomy u/s today completed and normal ( see below). Follow up appointment 4 wks.   Sandy BurkeAnnie Seaver Jenkins, CNM   ULTRASOUND REPORT  Location: [facility] Date of Service:  01/03/2018  Indications: Anatomy Findings:  Sandy JimSingleton intrauterine pregnancy is visualized with FHR at 157 BPM. Biometrics give an (U/S) Gestational age of [redacted] weeks and an (U/S) EDD of 05/23/18; this correlates with the clinically established EDD of 05/22/18.  Fetal presentation is transverse with fetal head to maternal right.  EFW: 336 grams (0lb 12oz). Placenta: Anterior and grade 1.  Placenta is approximately 3.6 cm from cervical os. AFI: WNL subjectively.  Anatomic survey is complete and appears WNL; Gender - Surprise.   Right Ovary measures 3.0 x 2.8 x 1.5 cm. It is normal in appearance. Left Ovary measures 3.0 x 2.2 x 1.8 cm. It is normal appearance. There is no obvious evidence of a corpus luteal cyst. Survey of the adnexa demonstrates no adnexal masses. There is no free peritoneal fluid in the cul de sac.  Impression: 1. 20 week Viable Singleton Intrauterine pregnancy by U/S. 2. (U/S) EDD is consistent with Clinically established (LMP) EDD of 05/22/18. 3. Normal Anatomy Scan  Recommendations: 1.Clinical correlation with the patient's History and Physical Exam.  Sandy BaarsJill Jenkins, RDMS

## 2018-01-03 NOTE — Patient Instructions (Signed)

## 2018-01-27 ENCOUNTER — Encounter: Payer: Medicaid Other | Admitting: Obstetrics and Gynecology

## 2018-01-28 ENCOUNTER — Encounter: Payer: Medicaid Other | Admitting: Obstetrics and Gynecology

## 2018-02-09 ENCOUNTER — Ambulatory Visit (INDEPENDENT_AMBULATORY_CARE_PROVIDER_SITE_OTHER): Payer: Medicaid Other | Admitting: Certified Nurse Midwife

## 2018-02-09 VITALS — BP 110/68 | HR 107 | Wt 189.1 lb

## 2018-02-09 DIAGNOSIS — Z3482 Encounter for supervision of other normal pregnancy, second trimester: Secondary | ICD-10-CM

## 2018-02-09 LAB — POCT URINALYSIS DIPSTICK OB
Bilirubin, UA: NEGATIVE
GLUCOSE, UA: NEGATIVE
Ketones, UA: NEGATIVE
LEUKOCYTES UA: NEGATIVE
Nitrite, UA: NEGATIVE
RBC UA: NEGATIVE
Spec Grav, UA: 1.015 (ref 1.010–1.025)
Urobilinogen, UA: 0.2 E.U./dL
pH, UA: 7.5 (ref 5.0–8.0)

## 2018-02-09 NOTE — Patient Instructions (Signed)

## 2018-02-09 NOTE — Progress Notes (Signed)
ROB doing well. Feels good movement. Discussed follow up in 3 wks for repeat glucose testing. She verbalizes and agrees to plan.   Doreene Burke, CNM

## 2018-03-01 ENCOUNTER — Other Ambulatory Visit: Payer: Medicaid Other

## 2018-03-01 ENCOUNTER — Ambulatory Visit (INDEPENDENT_AMBULATORY_CARE_PROVIDER_SITE_OTHER): Payer: Medicaid Other | Admitting: Obstetrics and Gynecology

## 2018-03-01 VITALS — BP 110/67 | HR 99 | Wt 189.3 lb

## 2018-03-01 DIAGNOSIS — Z3493 Encounter for supervision of normal pregnancy, unspecified, third trimester: Secondary | ICD-10-CM

## 2018-03-01 DIAGNOSIS — Z3482 Encounter for supervision of other normal pregnancy, second trimester: Secondary | ICD-10-CM

## 2018-03-01 DIAGNOSIS — O24410 Gestational diabetes mellitus in pregnancy, diet controlled: Secondary | ICD-10-CM

## 2018-03-01 LAB — POCT URINALYSIS DIPSTICK OB
BILIRUBIN UA: NEGATIVE
Glucose, UA: NEGATIVE
Ketones, UA: NEGATIVE
Nitrite, UA: NEGATIVE
PH UA: 7 (ref 5.0–8.0)
PROTEIN: NEGATIVE
RBC UA: NEGATIVE
Spec Grav, UA: 1.015 (ref 1.010–1.025)
UROBILINOGEN UA: 0.2 U/dL

## 2018-03-01 NOTE — Progress Notes (Signed)
ROB- glucola done today, blood consent signed,pt is doing well 

## 2018-03-01 NOTE — Progress Notes (Signed)
ROB- glucola done, doing well.discussed smoking cessation- down from 10 day to 6 day. Encouraged to quick.

## 2018-03-01 NOTE — Patient Instructions (Signed)

## 2018-03-02 ENCOUNTER — Other Ambulatory Visit: Payer: Self-pay | Admitting: Certified Nurse Midwife

## 2018-03-02 DIAGNOSIS — R7309 Other abnormal glucose: Secondary | ICD-10-CM

## 2018-03-02 LAB — CBC
HEMATOCRIT: 32.4 % — AB (ref 34.0–46.6)
HEMOGLOBIN: 11.2 g/dL (ref 11.1–15.9)
MCH: 32.5 pg (ref 26.6–33.0)
MCHC: 34.6 g/dL (ref 31.5–35.7)
MCV: 94 fL (ref 79–97)
Platelets: 272 10*3/uL (ref 150–450)
RBC: 3.45 x10E6/uL — ABNORMAL LOW (ref 3.77–5.28)
RDW: 13.6 % (ref 12.3–15.4)
WBC: 10.6 10*3/uL (ref 3.4–10.8)

## 2018-03-02 LAB — RPR: RPR: NONREACTIVE

## 2018-03-02 LAB — GLUCOSE, 1 HOUR GESTATIONAL: GESTATIONAL DIABETES SCREEN: 154 mg/dL — AB (ref 65–139)

## 2018-03-02 NOTE — Progress Notes (Signed)
Abnormal 1 hr glucose test. 3 hr ordered. Pt notified via my chart message

## 2018-03-04 LAB — URINE CULTURE

## 2018-03-17 ENCOUNTER — Encounter: Payer: Medicaid Other | Admitting: Certified Nurse Midwife

## 2018-03-22 ENCOUNTER — Ambulatory Visit (INDEPENDENT_AMBULATORY_CARE_PROVIDER_SITE_OTHER): Payer: Medicaid Other | Admitting: Obstetrics and Gynecology

## 2018-03-22 VITALS — BP 105/75 | HR 124 | Wt 184.9 lb

## 2018-03-22 DIAGNOSIS — O26893 Other specified pregnancy related conditions, third trimester: Secondary | ICD-10-CM

## 2018-03-22 DIAGNOSIS — R109 Unspecified abdominal pain: Secondary | ICD-10-CM

## 2018-03-22 NOTE — Progress Notes (Signed)
Work in [employer]-states felt some lower abdomen cramping this am that got worse when she got to work, with diarrhea x 1 episode and some waves of nausea. Placed on NST monitor here in the office and one 30sec uterine contraction noted in 30 minutes. Uterus otherwise soft and non-tender. reassured of findings and feel like may have stomach bug starting- encouraged po hydration and bland diet for 24 hours. RTC is symptoms worsen.

## 2018-03-22 NOTE — Progress Notes (Signed)
OB WORK IN- pt c/o pelvic pressure, contractions about 3 minutes apart this am

## 2018-03-29 ENCOUNTER — Ambulatory Visit (INDEPENDENT_AMBULATORY_CARE_PROVIDER_SITE_OTHER): Payer: Medicaid Other | Admitting: Obstetrics and Gynecology

## 2018-03-29 VITALS — BP 115/74 | HR 101 | Wt 183.1 lb

## 2018-03-29 DIAGNOSIS — Z3493 Encounter for supervision of normal pregnancy, unspecified, third trimester: Secondary | ICD-10-CM

## 2018-03-29 LAB — POCT URINALYSIS DIPSTICK OB
Bilirubin, UA: NEGATIVE
GLUCOSE, UA: NEGATIVE
Ketones, UA: NEGATIVE
Nitrite, UA: NEGATIVE
PH UA: 7 (ref 5.0–8.0)
PROTEIN: NEGATIVE
RBC UA: NEGATIVE
Spec Grav, UA: 1.01 (ref 1.010–1.025)
Urobilinogen, UA: 0.2 E.U./dL

## 2018-03-29 NOTE — Addendum Note (Signed)
Addended by: Rosine BeatLONTZ, Chantea Surace L on: 03/29/2018 03:41 PM   Modules accepted: Orders

## 2018-03-29 NOTE — Progress Notes (Signed)
ROB- pt is doing well, feeling much better

## 2018-03-29 NOTE — Progress Notes (Signed)
ROB: doing well, no concerns today. 

## 2018-03-31 LAB — URINE CULTURE

## 2018-04-01 ENCOUNTER — Telehealth: Payer: Self-pay | Admitting: Certified Nurse Midwife

## 2018-04-01 NOTE — Telephone Encounter (Signed)
The patient called and stated that she is experiencing excruciating pain on the right side of her face, the patient went to the dentist and they informed her that the pain is NOT coming from a dental issue. The patient would like to know if something can be sent in for pain if possible or if she can receive a call back sometime today to be advised of what to do next. Please advise.

## 2018-04-12 ENCOUNTER — Ambulatory Visit (INDEPENDENT_AMBULATORY_CARE_PROVIDER_SITE_OTHER): Payer: Medicaid Other | Admitting: Certified Nurse Midwife

## 2018-04-12 VITALS — BP 113/74 | HR 98 | Wt 187.0 lb

## 2018-04-12 DIAGNOSIS — R102 Pelvic and perineal pain: Secondary | ICD-10-CM

## 2018-04-12 DIAGNOSIS — Z3493 Encounter for supervision of normal pregnancy, unspecified, third trimester: Secondary | ICD-10-CM

## 2018-04-12 DIAGNOSIS — O26893 Other specified pregnancy related conditions, third trimester: Secondary | ICD-10-CM

## 2018-04-12 LAB — POCT URINALYSIS DIPSTICK OB
Bilirubin, UA: NEGATIVE
Blood, UA: NEGATIVE
Glucose, UA: NEGATIVE
Ketones, UA: NEGATIVE
NITRITE UA: NEGATIVE
POC,PROTEIN,UA: NEGATIVE
Spec Grav, UA: 1.01 (ref 1.010–1.025)
UROBILINOGEN UA: 0.2 U/dL
pH, UA: 6.5 (ref 5.0–8.0)

## 2018-04-12 NOTE — Patient Instructions (Signed)
Round Ligament Pain  The round ligament is a cord of muscle and tissue that helps support the uterus. It can become a source of pain during pregnancy if it becomes stretched or twisted as the baby grows. The pain usually begins in the second trimester (13-28 weeks) of pregnancy, and it can come and go until the baby is delivered. It is not a serious problem, and it does not cause harm to the baby. Round ligament pain is usually a short, sharp, and pinching pain, but it can also be a dull, lingering, and aching pain. The pain is felt in the lower side of the abdomen or in the groin. It usually starts deep in the groin and moves up to the outside of the hip area. The pain may occur when you:  Suddenly change position, such as quickly going from a sitting to standing position.  Roll over in bed.  Cough or sneeze.  Do physical activity. Follow these instructions at home:   Watch your condition for any changes.  When the pain starts, relax. Then try any of these methods to help with the pain: ? Sitting down. ? Flexing your knees up to your abdomen. ? Lying on your side with one pillow under your abdomen and another pillow between your legs. ? Sitting in a warm bath for 15-20 minutes or until the pain goes away.  Take over-the-counter and prescription medicines only as told by your health care provider.  Move slowly when you sit down or stand up.  Avoid long walks if they cause pain.  Stop or reduce your physical activities if they cause pain.  Keep all follow-up visits as told by your health care provider. This is important. Contact a health care provider if:  Your pain does not go away with treatment.  You feel pain in your back that you did not have before.  Your medicine is not helping. Get help right away if:  You have a fever or chills.  You develop uterine contractions.  You have vaginal bleeding.  You have nausea or vomiting.  You have diarrhea.  You have pain  when you urinate. Summary  Round ligament pain is felt in the lower abdomen or groin. It is usually a short, sharp, and pinching pain. It can also be a dull, lingering, and aching pain.  This pain usually begins in the second trimester (13-28 weeks). It occurs because the uterus is stretching with the growing baby, and it is not harmful to the baby.  You may notice the pain when you suddenly change position, when you cough or sneeze, or during physical activity.  Relaxing, flexing your knees to your abdomen, lying on one side, or taking a warm bath may help to get rid of the pain.  Get help from your health care provider if the pain does not go away or if you have vaginal bleeding, nausea, vomiting, diarrhea, or painful urination. This information is not intended to replace advice given to you by your health care provider. Make sure you discuss any questions you have with your health care provider. Document Released: 01/07/2008 Document Revised: 09/15/2017 Document Reviewed: 09/15/2017 Elsevier Interactive Patient Education  2019 [employer] Back Pain in Pregnancy Back pain during pregnancy is common. Back pain may be caused by several factors that are related to changes during your pregnancy. Follow these instructions at home: Managing pain, stiffness, and swelling      If directed, for sudden (acute) back pain, put ice on the  painful area. ? Put ice in a plastic bag. ? Place a towel between your skin and the bag. ? Leave the ice on for 20 minutes, 2-3 times per day.  If directed, apply heat to the affected area before you exercise. Use the heat source that your health care provider recommends, such as a moist heat pack or a heating pad. ? Place a towel between your skin and the heat source. ? Leave the heat on for 20-30 minutes. ? Remove the heat if your skin turns bright red. This is especially important if you are unable to feel pain, heat, or cold. You may have a greater risk  of getting burned.  If directed, massage the affected area. Activity  Exercise as told by your health care provider. Gentle exercise is the best way to prevent or manage back pain.  Listen to your body when lifting. If lifting hurts, ask for help or bend your knees. This uses your leg muscles instead of your back muscles.  Squat down when picking up something from the floor. Do not bend over.  Only use bed rest for short periods as told by your health care provider. Bed rest should only be used for the most severe episodes of back pain. Standing, sitting, and lying down  Do not stand in one place for long periods of time.  Use good posture when sitting. Make sure your head rests over your shoulders and is not hanging forward. Use a pillow on your lower back if necessary.  Try sleeping on your side, preferably the left side, with a pregnancy support pillow or 1-2 regular pillows between your legs. ? If you have back pain after a night's rest, your bed may be too soft. ? A firm mattress may provide more support for your back during pregnancy. General instructions  Do not wear high heels.  Eat a healthy diet. Try to gain weight within your health care provider's recommendations.  Use a maternity girdle, elastic sling, or back brace as told by your health care provider.  Take over-the-counter and prescription medicines only as told by your health care provider.  Work with a physical therapist or massage therapist to find ways to manage back pain. Acupuncture or massage therapy may be helpful.  Keep all follow-up visits as told by your health care provider. This is important. Contact a health care provider if:  Your back pain interferes with your daily activities.  You have increasing pain in other parts of your body. Get help right away if:  You develop numbness, tingling, weakness, or problems with the use of your arms or legs.  You develop severe back pain that is not  controlled with medicine.  You have a change in bowel or bladder control.  You develop shortness of breath, dizziness, or you faint.  You develop nausea, vomiting, or sweating.  You have back pain that is a rhythmic, cramping pain similar to labor pains. Labor pain is usually 1-2 minutes apart, lasts for about 1 minute, and involves a bearing down feeling or pressure in your pelvis.  You have back pain and your water breaks or you have vaginal bleeding.  You have back pain or numbness that travels down your leg.  Your back pain developed after you fell.  You develop pain on one side of your back.  You see blood in your urine.  You develop skin blisters in the area of your back pain. Summary  Back pain may be caused by several  factors that are related to changes during your pregnancy.  Follow instructions as told by your health care provider for managing pain, stiffness, and swelling.  Exercise as told by your health care provider. Gentle exercise is the best way to prevent or manage back pain.  Take over-the-counter and prescription medicines only as told by your health care provider.  Keep all follow-up visits as told by your health care provider. This is important. This information is not intended to replace advice given to you by your health care provider. Make sure you discuss any questions you have with your health care provider. Document Released: 07/08/2005 Document Revised: 09/15/2017 Document Reviewed: 09/15/2017 Elsevier Interactive Patient Education  2019 [employer] Inc.  

## 2018-04-12 NOTE — Progress Notes (Signed)
ROB-Doing well except for increased pelvic pain when working. Discussed home treatment measures including use of abdominal support. Plans epidural and formula feeding. Naming son Pamelia HoitLevi. Anticipatory guidance regarding 36 week cultures and course of prenatal care. Reviewed red flag symptoms and when to call. RTC x 2 weeks for cultures and ROB or sooner if needed.

## 2018-04-12 NOTE — Progress Notes (Signed)
ROB, c/o intermittent lower pelvic pain that started yesterday.

## 2018-04-13 NOTE — L&D Delivery Note (Signed)
Delivery Note At 1029am  a viable and healthy female "Sandy Jenkins"  was delivered via  (Presentation:LOA ;  ).  APGAR:9 ,10  .   Placenta status: delivered intact with 3 vessel Cord:  with the following complications: De Borgia x 1 easily reduced on perineum  Anesthesia:  epidural Episiotomy:  none Lacerations:  superficial peri-urethral lacerations- no repair needed. Suture Repair: NA Est. Blood Loss (mL):  300  Mom to postpartum.  Baby to Couplet care / Skin to Skin.  Melody N Shambley 05/14/2018, 10:51 AM

## 2018-04-26 ENCOUNTER — Encounter: Payer: Self-pay | Admitting: Certified Nurse Midwife

## 2018-04-26 ENCOUNTER — Ambulatory Visit (INDEPENDENT_AMBULATORY_CARE_PROVIDER_SITE_OTHER): Payer: Medicaid Other | Admitting: Certified Nurse Midwife

## 2018-04-26 VITALS — BP 110/73 | HR 90 | Wt 185.6 lb

## 2018-04-26 DIAGNOSIS — Z3493 Encounter for supervision of normal pregnancy, unspecified, third trimester: Secondary | ICD-10-CM | POA: Diagnosis not present

## 2018-04-26 LAB — POCT URINALYSIS DIPSTICK OB
Bilirubin, UA: NEGATIVE
Glucose, UA: NEGATIVE
Ketones, UA: NEGATIVE
Nitrite, UA: NEGATIVE
Spec Grav, UA: <=1.005 — AB (ref 1.010–1.025)
Urobilinogen, UA: 0.2 E.U./dL
pH, UA: 8.5 — AB (ref 5.0–8.0)

## 2018-04-26 NOTE — Patient Instructions (Signed)
WE WOULD LOVE TO HEAR FROM YOU!!!!   Thank you Sandy Jenkins for visiting [facility].  Providing our patients with the best experience possible is really important to [place], and we hope that you felt that on your recent visit. The most valuable feedback we get comes from YOU!!    If you receive a survey please take a couple of minutes to let us know how we did.Thank you for continuing to trust [place] with your care.   [facility] B Streptococcus Infection During Pregnancy  Group B Streptococcus (GBS) is a type of bacteria (Streptococcus agalactiae) that is often found in healthy people, commonly in the rectum, vagina, and intestines. In people who are healthy and not pregnant, the bacteria rarely cause serious illness or complications. However, women who test positive for GBS during pregnancy can pass the bacteria to their baby during childbirth, which can cause serious infection in the baby after birth. Women with GBS may also have infections during their pregnancy or immediately after childbirth, such as such as urinary tract infections (UTIs) or infections of the uterus (uterine infections). Having GBS also increases a woman's risk of complications during pregnancy, such as early (preterm) labor or delivery, miscarriage, or stillbirth. Routine testing (screening) for GBS is recommended for all pregnant women. What increases the risk? You may have a higher risk for GBS infection during pregnancy if you had one during a past pregnancy. What are the signs or symptoms? In most cases, GBS infection does not cause symptoms in pregnant women. Signs and symptoms of a possible GBS-related infection may include:  Labor starting before the 37th week of pregnancy.  A UTI or bladder infection, which may cause: ? Fever. ? Pain or burning during urination. ? Frequent urination.  Fever during labor, along with: ? Bad-smelling discharge. ? Uterine  tenderness. ? Rapid heartbeat in the mother, baby, or both. Rare but serious symptoms of a possible GBS-related infection in women include:  Blood infection (septicemia). This may cause fever, chills, or confusion.  Lung infection (pneumonia). This may cause fever, chills, cough, rapid breathing, difficulty breathing, or chest pain.  Bone, joint, skin, or soft tissue infection. How is this diagnosed? You may be screened for GBS between week 35 and week 37 of your pregnancy. If you have symptoms of preterm labor, you may be screened earlier. This condition is diagnosed based on lab test results from:  A swab of fluid from the vagina and rectum.  A urine sample. How is this treated? This condition is treated with antibiotic medicine. When you go into labor, or as soon as your water breaks (your membranes rupture), you will be given antibiotics through an IV tube. Antibiotics will continue until after you give birth. If you are having a cesarean delivery, you do not need antibiotics unless your membranes have already ruptured. Follow these instructions at home:  Take over-the-counter and prescription medicines only as told by your health care provider.  Take your antibiotic medicine as told by your health care provider. Do not stop taking the antibiotic even if you start to feel better.  Keep all pre-birth (prenatal) visits and follow-up visits as told by your health care provider. This is important. Contact a health care provider if:  You have pain or burning when you urinate.  You have to urinate frequently.  You have a fever or chills.  You develop a bad-smelling vaginal discharge. Get help right away if:  Your membranes rupture.  You  go into labor.  You have severe pain in your abdomen.  You have difficulty breathing.  You have chest pain. This information is not intended to replace advice given to you by your health care provider. Make sure you discuss any questions you  have with your health care provider. Document Released: 07/07/2007 Document Revised: 10/25/2015 Document Reviewed: 10/24/2015 Elsevier Interactive Patient Education  [employer].

## 2018-04-26 NOTE — Progress Notes (Signed)
ROB doing well. Complains of pelvic pressure and vaginal pressure. GBS and cultures today. Pt decline SVE today. Reviewed signs and symptoms labor. Follow up 1 wk with Melody.   Doreene Burke, CNM

## 2018-04-28 LAB — GC/CHLAMYDIA PROBE AMP
Chlamydia trachomatis, NAA: NEGATIVE
Neisseria gonorrhoeae by PCR: NEGATIVE

## 2018-04-28 LAB — STREP GP B NAA: Strep Gp B NAA: NEGATIVE

## 2018-05-05 ENCOUNTER — Ambulatory Visit (INDEPENDENT_AMBULATORY_CARE_PROVIDER_SITE_OTHER): Payer: Medicaid Other | Admitting: Obstetrics and Gynecology

## 2018-05-05 VITALS — BP 125/82 | HR 93 | Wt 187.1 lb

## 2018-05-05 DIAGNOSIS — Z3493 Encounter for supervision of normal pregnancy, unspecified, third trimester: Secondary | ICD-10-CM

## 2018-05-05 LAB — POCT URINALYSIS DIPSTICK OB
Bilirubin, UA: NEGATIVE
Blood, UA: NEGATIVE
Glucose, UA: NEGATIVE
Ketones, UA: NEGATIVE
Nitrite, UA: NEGATIVE
POC,PROTEIN,UA: NEGATIVE
Spec Grav, UA: 1.01 (ref 1.010–1.025)
Urobilinogen, UA: 0.2 E.U./dL
pH, UA: 6 (ref 5.0–8.0)

## 2018-05-05 NOTE — Progress Notes (Signed)
ROB-pt is c/o pelvic pressure 

## 2018-05-05 NOTE — Progress Notes (Signed)
ROB- reviewed negative cultures. Labor precautions discussed.circumcision clinic info given

## 2018-05-05 NOTE — Addendum Note (Signed)
Addended by: Lessie Dings, AMY L on: 05/05/2018 02:00 PM   Modules accepted: Orders

## 2018-05-07 LAB — URINE CULTURE

## 2018-05-12 ENCOUNTER — Ambulatory Visit (INDEPENDENT_AMBULATORY_CARE_PROVIDER_SITE_OTHER): Payer: Medicaid Other | Admitting: Certified Nurse Midwife

## 2018-05-12 VITALS — BP 131/94 | HR 96 | Wt 185.1 lb

## 2018-05-12 DIAGNOSIS — Z3493 Encounter for supervision of normal pregnancy, unspecified, third trimester: Secondary | ICD-10-CM | POA: Diagnosis not present

## 2018-05-12 LAB — POCT URINALYSIS DIPSTICK OB
Bilirubin, UA: NEGATIVE
Blood, UA: NEGATIVE
Glucose, UA: NEGATIVE
Ketones, UA: NEGATIVE
Nitrite, UA: NEGATIVE
POC,PROTEIN,UA: NEGATIVE
Spec Grav, UA: <=1.005 — AB (ref 1.010–1.025)
Urobilinogen, UA: 0.2 E.U./dL
pH, UA: 6.5 (ref 5.0–8.0)

## 2018-05-12 NOTE — Patient Instructions (Signed)
WE WOULD LOVE TO HEAR FROM YOU!!!!   Thank you Sandy Jenkins for visiting [facility].  Providing our patients with the best experience possible is really important to [place], and we hope that you felt that on your recent visit. The most valuable feedback we get comes from YOU!!    If you receive a survey please take a couple of minutes to let us know how we did.Thank you for continuing to trust [place] with your care.   [facility]   Vaginal Delivery  Vaginal delivery means that you give birth by pushing your baby out of your birth canal (vagina). A team of health care providers will help you before, during, and after vaginal delivery. Birth experiences are unique for every woman and every pregnancy, and birth experiences vary depending on where you choose to give birth. What happens when I arrive at the birth center or hospital? Once you are in labor and have been admitted into the hospital or birth center, your health care provider may:  Review your pregnancy history and any concerns that you have.  Insert an IV into one of your veins. This may be used to give you fluids and medicines.  Check your blood pressure, pulse, temperature, and heart rate (vital signs).  Check whether your bag of water (amniotic sac) has broken (ruptured).  Talk with you about your birth plan and discuss pain control options. Monitoring Your health care provider may monitor your contractions (uterine monitoring) and your baby's heart rate (fetal monitoring). You may need to be monitored:  Often, but not continuously (intermittently).  All the time or for long periods at a time (continuously). Continuous monitoring may be needed if: ? You are taking certain medicines, such as medicine to relieve pain or make your contractions stronger. ? You have pregnancy or labor complications. Monitoring may be done by:  Placing a special stethoscope or a handheld monitoring device on your abdomen  to check your baby's heartbeat and to check for contractions.  Placing monitors on your abdomen (external monitors) to record your baby's heartbeat and the frequency and length of contractions.  Placing monitors inside your uterus through your vagina (internal monitors) to record your baby's heartbeat and the frequency, length, and strength of your contractions. Depending on the type of monitor, it may remain in your uterus or on your baby's head until birth.  Telemetry. This is a type of continuous monitoring that can be done with external or internal monitors. Instead of having to stay in bed, you are able to move around during telemetry. Physical exam Your health care provider may perform frequent physical exams. This may include:  Checking how and where your baby is positioned in your uterus.  Checking your cervix to determine: ? Whether it is thinning out (effacing). ? Whether it is opening up (dilating). What happens during labor and delivery?  Normal labor and delivery is divided into the following three stages: Stage 1  This is the longest stage of labor.  This stage can last for hours or days.  Throughout this stage, you will feel contractions. Contractions generally feel mild, infrequent, and irregular at first. They get stronger, more frequent (about every 2-3 minutes), and more regular as you move through this stage.  This stage ends when your cervix is completely dilated to 4 inches (10 cm) and completely effaced. Stage 2  This stage starts once your cervix is completely effaced and dilated and lasts until the delivery of your  baby.  This stage may last from 20 minutes to 2 hours.  This is the stage where you will feel an urge to push your baby out of your vagina.  You may feel stretching and burning pain, especially when the widest part of your baby's head passes through the vaginal opening (crowning).  Once your baby is delivered, the umbilical cord will be clamped  and cut. This usually occurs after waiting a period of 1-2 minutes after delivery.  Your baby will be placed on your bare chest (skin-to-skin contact) in an upright position and covered with a warm blanket. Watch your baby for feeding cues, like rooting or sucking, and help the baby to your breast for his or her first feeding. Stage 3  This stage starts immediately after the birth of your baby and ends after you deliver the placenta.  This stage may take anywhere from 5 to 30 minutes.  After your baby has been delivered, you will feel contractions as your body expels the placenta and your uterus contracts to control bleeding. What can I expect after labor and delivery?  After labor is over, you and your baby will be monitored closely until you are ready to go home to ensure that you are both healthy. Your health care team will teach you how to care for yourself and your baby.  You and your baby will stay in the same room (rooming in) during your hospital stay. This will encourage early bonding and successful breastfeeding.  You may continue to receive fluids and medicines through an IV.  Your uterus will be checked and massaged regularly (fundal massage).  You will have some soreness and pain in your abdomen, vagina, and the area of skin between your vaginal opening and your anus (perineum).  If an incision was made near your vagina (episiotomy) or if you had some vaginal tearing during delivery, cold compresses may be placed on your episiotomy or your tear. This helps to reduce pain and swelling.  You may be given a squirt bottle to use instead of wiping when you go to the bathroom. To use the squirt bottle, follow these steps: ? Before you urinate, fill the squirt bottle with warm water. Do not use hot water. ? After you urinate, while you are sitting on the toilet, use the squirt bottle to rinse the area around your urethra and vaginal opening. This rinses away any urine and  blood. ? Fill the squirt bottle with clean water every time you use the bathroom.  It is normal to have vaginal bleeding after delivery. Wear a sanitary pad for vaginal bleeding and discharge. Summary  Vaginal delivery means that you will give birth by pushing your baby out of your birth canal (vagina).  Your health care provider may monitor your contractions (uterine monitoring) and your baby's heart rate (fetal monitoring).  Your health care provider may perform a physical exam.  Normal labor and delivery is divided into three stages.  After labor is over, you and your baby will be monitored closely until you are ready to go home. This information is not intended to replace advice given to you by your health care provider. Make sure you discuss any questions you have with your health care provider. Document Released: 01/07/2008 Document Revised: 05/04/2017 Document Reviewed: 05/04/2017 Elsevier Interactive Patient Education  2019 [employer]

## 2018-05-12 NOTE — Progress Notes (Signed)
ROB-Patient c/o intermittent right buttock pain x2 weeks, intermittent lower back pain x2 days and "a lot of pelvic pressure for a while".  BP recheck 122/76.

## 2018-05-12 NOTE — Progress Notes (Signed)
ROB- Side lying release performed for right buttock pain. SVE performed by CNM, unchanged from previous visit. Encouraged belly band for lower back pain. Pediatrician [facility] (providers rotate). Reviewed red flag symptoms and when to call. RTC x1 week for ROB w/ Sandy Jenkins.

## 2018-05-14 ENCOUNTER — Inpatient Hospital Stay: Payer: Medicaid Other | Admitting: Anesthesiology

## 2018-05-14 ENCOUNTER — Inpatient Hospital Stay
Admission: EM | Admit: 2018-05-14 | Discharge: 2018-05-15 | DRG: 807 | Disposition: A | Discharge status: Home / Self Care | Payer: Medicaid Other | Attending: Obstetrics and Gynecology | Admitting: Obstetrics and Gynecology

## 2018-05-14 ENCOUNTER — Other Ambulatory Visit: Payer: Self-pay

## 2018-05-14 DIAGNOSIS — O99334 Smoking (tobacco) complicating childbirth: Secondary | ICD-10-CM | POA: Diagnosis present

## 2018-05-14 DIAGNOSIS — O9902 Anemia complicating childbirth: Secondary | ICD-10-CM | POA: Diagnosis present

## 2018-05-14 DIAGNOSIS — F17210 Nicotine dependence, cigarettes, uncomplicated: Secondary | ICD-10-CM | POA: Diagnosis present

## 2018-05-14 DIAGNOSIS — Z3A38 38 weeks gestation of pregnancy: Secondary | ICD-10-CM | POA: Diagnosis not present

## 2018-05-14 DIAGNOSIS — D649 Anemia, unspecified: Secondary | ICD-10-CM | POA: Diagnosis present

## 2018-05-14 DIAGNOSIS — Z880 Allergy status to penicillin: Secondary | ICD-10-CM | POA: Diagnosis not present

## 2018-05-14 DIAGNOSIS — Z3483 Encounter for supervision of other normal pregnancy, third trimester: Secondary | ICD-10-CM | POA: Diagnosis present

## 2018-05-14 LAB — TYPE AND SCREEN
ABO/RH(D): A POS
Antibody Screen: NEGATIVE

## 2018-05-14 LAB — CBC
HCT: 33.6 % — ABNORMAL LOW (ref 36.0–46.0)
Hemoglobin: 11.9 g/dL — ABNORMAL LOW (ref 12.0–15.0)
MCH: 32.4 pg (ref 26.0–34.0)
MCHC: 35.4 g/dL (ref 30.0–36.0)
MCV: 91.6 fL (ref 80.0–100.0)
Platelets: 345 10*3/uL (ref 150–400)
RBC: 3.67 MIL/uL — ABNORMAL LOW (ref 3.87–5.11)
RDW: 13.7 % (ref 11.5–15.5)
WBC: 12.5 10*3/uL — ABNORMAL HIGH (ref 4.0–10.5)
nRBC: 0 % (ref 0.0–0.2)

## 2018-05-14 MED ORDER — ACETAMINOPHEN 325 MG PO TABS: 650.0000 mg | ORAL_TABLET | ORAL | Status: DC | PRN

## 2018-05-14 MED ORDER — IBUPROFEN 600 MG PO TABS
600.0000 mg | ORAL_TABLET | Freq: Four times a day (QID) | ORAL | Status: DC
  Administered 2018-05-14 – 2018-05-15 (×5): 600 mg via ORAL
  Filled 2018-05-14 (×5): qty 1

## 2018-05-14 MED ORDER — LACTATED RINGERS IV SOLN
INTRAVENOUS | Status: DC
  Administered 2018-05-14: 08:00:00 via INTRAVENOUS

## 2018-05-14 MED ORDER — WITCH HAZEL-GLYCERIN EX PADS
1.0000 "application " | MEDICATED_PAD | CUTANEOUS | Status: DC | PRN
  Filled 2018-05-14: qty 100

## 2018-05-14 MED ORDER — DIPHENHYDRAMINE HCL 50 MG/ML IJ SOLN: 12.5000 mg | INTRAMUSCULAR | Status: DC | PRN

## 2018-05-14 MED ORDER — ONDANSETRON HCL 4 MG PO TABS: 4.0000 mg | ORAL_TABLET | ORAL | Status: DC | PRN

## 2018-05-14 MED ORDER — PHENYLEPHRINE 40 MCG/ML (10ML) SYRINGE FOR IV PUSH (FOR BLOOD PRESSURE SUPPORT): 80.0000 ug | PREFILLED_SYRINGE | INTRAVENOUS | Status: DC | PRN

## 2018-05-14 MED ORDER — EPHEDRINE 5 MG/ML INJ: 10.0000 mg | INTRAVENOUS | Status: DC | PRN

## 2018-05-14 MED ORDER — LIDOCAINE HCL (PF) 1 % IJ SOLN
INTRAMUSCULAR | Status: AC
  Filled 2018-05-14: qty 30

## 2018-05-14 MED ORDER — AMMONIA AROMATIC IN INHA
RESPIRATORY_TRACT | Status: AC
  Filled 2018-05-14: qty 10

## 2018-05-14 MED ORDER — SOD CITRATE-CITRIC ACID 500-334 MG/5ML PO SOLN: 30.0000 mL | ORAL | Status: DC | PRN

## 2018-05-14 MED ORDER — LACTATED RINGERS IV SOLN: 500.0000 mL | Freq: Once | INTRAVENOUS | Status: DC

## 2018-05-14 MED ORDER — FENTANYL 2.5 MCG/ML W/ROPIVACAINE 0.15% IN NS 100 ML EPIDURAL (ARMC)
12.0000 mL/h | EPIDURAL | Status: DC
  Administered 2018-05-14: 12 mL/h via EPIDURAL

## 2018-05-14 MED ORDER — DIBUCAINE 1 % RE OINT
1.0000 "application " | TOPICAL_OINTMENT | RECTAL | Status: DC | PRN
  Filled 2018-05-14: qty 28

## 2018-05-14 MED ORDER — ONDANSETRON HCL 4 MG/2ML IJ SOLN: 4.0000 mg | Freq: Four times a day (QID) | INTRAMUSCULAR | Status: DC | PRN

## 2018-05-14 MED ORDER — DIPHENHYDRAMINE HCL 25 MG PO CAPS: 25.0000 mg | ORAL_CAPSULE | Freq: Four times a day (QID) | ORAL | Status: DC | PRN

## 2018-05-14 MED ORDER — MISOPROSTOL 200 MCG PO TABS
ORAL_TABLET | ORAL | Status: AC
  Filled 2018-05-14: qty 4

## 2018-05-14 MED ORDER — OXYTOCIN 40 UNITS IN NORMAL SALINE INFUSION - SIMPLE MED
2.5000 [IU]/h | INTRAVENOUS | Status: DC
  Administered 2018-05-14: 2.5 [IU]/h via INTRAVENOUS
  Filled 2018-05-14: qty 1000

## 2018-05-14 MED ORDER — OXYTOCIN 10 UNIT/ML IJ SOLN
INTRAMUSCULAR | Status: AC
  Filled 2018-05-14: qty 2

## 2018-05-14 MED ORDER — BENZOCAINE-MENTHOL 20-0.5 % EX AERO
1.0000 "application " | INHALATION_SPRAY | CUTANEOUS | Status: DC | PRN
  Administered 2018-05-14: 1 via TOPICAL
  Filled 2018-05-14: qty 56

## 2018-05-14 MED ORDER — COCONUT OIL OIL: 1.0000 "application " | TOPICAL_OIL | Status: DC | PRN

## 2018-05-14 MED ORDER — OXYTOCIN BOLUS FROM INFUSION
500.0000 mL | Freq: Once | INTRAVENOUS | Status: AC
  Administered 2018-05-14: 500 mL via INTRAVENOUS

## 2018-05-14 MED ORDER — MEASLES, MUMPS & RUBELLA VAC IJ SOLR
0.5000 mL | Freq: Once | INTRAMUSCULAR | Status: DC
  Filled 2018-05-14 (×2): qty 0.5

## 2018-05-14 MED ORDER — SIMETHICONE 80 MG PO CHEW: 80.0000 mg | CHEWABLE_TABLET | ORAL | Status: DC | PRN

## 2018-05-14 MED ORDER — FENTANYL 2.5 MCG/ML W/ROPIVACAINE 0.15% IN NS 100 ML EPIDURAL (ARMC)
EPIDURAL | Status: AC
  Filled 2018-05-14: qty 100

## 2018-05-14 MED ORDER — DOCUSATE SODIUM 100 MG PO CAPS
100.0000 mg | ORAL_CAPSULE | Freq: Two times a day (BID) | ORAL | Status: DC
  Administered 2018-05-14 – 2018-05-15 (×2): 100 mg via ORAL
  Filled 2018-05-14 (×2): qty 1

## 2018-05-14 MED ORDER — LIDOCAINE HCL (PF) 1 % IJ SOLN
INTRAMUSCULAR | Status: DC | PRN
  Administered 2018-05-14: 1 mL via INTRADERMAL

## 2018-05-14 MED ORDER — LIDOCAINE HCL (PF) 1 % IJ SOLN: 30.0000 mL | INTRAMUSCULAR | Status: DC | PRN

## 2018-05-14 MED ORDER — SODIUM CHLORIDE 0.9 % IV SOLN
INTRAVENOUS | Status: DC | PRN
  Administered 2018-05-14 (×2): 5 mL via EPIDURAL

## 2018-05-14 MED ORDER — ONDANSETRON HCL 4 MG/2ML IJ SOLN: 4.0000 mg | INTRAMUSCULAR | Status: DC | PRN

## 2018-05-14 MED ORDER — PRENATAL MULTIVITAMIN CH
1.0000 | ORAL_TABLET | Freq: Every day | ORAL | Status: DC
  Administered 2018-05-14 – 2018-05-15 (×2): 1 via ORAL
  Filled 2018-05-14 (×2): qty 1

## 2018-05-14 MED ORDER — FENTANYL CITRATE (PF) 100 MCG/2ML IJ SOLN
50.0000 ug | Freq: Once | INTRAMUSCULAR | Status: AC
  Administered 2018-05-14: 50 ug via INTRAVENOUS
  Filled 2018-05-14: qty 2

## 2018-05-14 MED ORDER — LIDOCAINE-EPINEPHRINE (PF) 1.5 %-1:200000 IJ SOLN
INTRAMUSCULAR | Status: DC | PRN
  Administered 2018-05-14: 3 mL via EPIDURAL

## 2018-05-14 MED ORDER — LACTATED RINGERS IV SOLN
500.0000 mL | INTRAVENOUS | Status: DC | PRN
  Administered 2018-05-14: 1000 mL via INTRAVENOUS

## 2018-05-14 NOTE — Anesthesia Procedure Notes (Signed)
Epidural Patient location during procedure: OB Start time: 05/14/2018 4:35 AM End time: 05/14/2018 4:38 AM  Staffing Anesthesiologist: , Cleda MccreedyJoseph K, MD Performed: anesthesiologist   Preanesthetic Checklist Completed: patient identified, site marked, surgical consent, pre-op evaluation, timeout performed, IV checked, risks and benefits discussed and monitors and equipment checked  Epidural Patient position: sitting Prep: ChloraPrep Patient monitoring: heart rate, continuous pulse ox and blood pressure Approach: midline Location: L3-L4 Injection technique: LOR saline  Needle:  Needle type: Tuohy  Needle gauge: 17 G Needle length: 9 cm and 9 Needle insertion depth: 5.5 cm Catheter type: closed end flexible Catheter size: 19 Gauge Catheter at skin depth: 11 cm Test dose: negative and 1.5% lidocaine with Epi 1:200 K  Assessment Sensory level: T10 Events: blood not aspirated, injection not painful, no injection resistance, negative IV test and no paresthesia  Additional Notes 1 attempt Pt. Evaluated and documentation done after procedure finished. Patient identified. Risks/Benefits/Options discussed with patient including but not limited to bleeding, infection, nerve damage, paralysis, failed block, incomplete pain control, headache, blood pressure changes, nausea, vomiting, reactions to medication both or allergic, itching and postpartum back pain. Confirmed with bedside nurse the patient's most recent platelet count. Confirmed with patient that they are not currently taking any anticoagulation, have any bleeding history or any family history of bleeding disorders. Patient expressed understanding and wished to proceed. All questions were answered. Sterile technique was used throughout the entire procedure. Please see nursing notes for vital signs. Test dose was given through epidural catheter and negative prior to continuing to dose epidural or start infusion. Warning signs of  high block given to the patient including shortness of breath, tingling/numbness in hands, complete motor block, or any concerning symptoms with instructions to call for help. Patient was given instructions on fall risk and not to get out of bed. All questions and concerns addressed with instructions to call with any issues or inadequate analgesia.   Patient tolerated the insertion well without immediate complications.Reason for block:procedure for pain

## 2018-05-14 NOTE — Progress Notes (Signed)
Pt requests epidural 0405 Dr Orson Ape notified 0422 Dr Orson Ape at [facility] 0438 Test dose 88 Maternal HR 0444 Bolus 1 0447 Bolus 2 0447Drip started

## 2018-05-14 NOTE — Anesthesia Preprocedure Evaluation (Signed)
Anesthesia Evaluation  Patient identified by MRN, date of birth, ID band Patient awake    Reviewed: Allergy & Precautions, H&P , NPO status , Patient's Chart, lab work & pertinent test results  History of Anesthesia Complications (+) history of anesthetic complications (last epidural stopped working)  Airway Mallampati: III  TM Distance: <3 FB Neck ROM: full    Dental  (+) Chipped, Poor Dentition   Pulmonary Current Smoker,           Cardiovascular Exercise Tolerance: Good (-) hypertensionnegative cardio ROS       Neuro/Psych    GI/Hepatic GERD  ,  Endo/Other  diabetes, Gestational  Renal/GU   negative genitourinary   Musculoskeletal   Abdominal   Peds  Hematology negative hematology ROS (+)   Anesthesia Other Findings Past Medical History: No date: Gestational diabetes No date: History of miscarriage  History reviewed. No pertinent surgical history.  BMI    Body Mass Index:  32.77 kg/m      Reproductive/Obstetrics (+) Pregnancy                             Anesthesia Physical Anesthesia Plan  ASA: III  Anesthesia Plan: Epidural   Post-op Pain Management:    Induction:   PONV Risk Score and Plan:   Airway Management Planned:   Additional Equipment:   Intra-op Plan:   Post-operative Plan:   Informed Consent: I have reviewed the patients History and Physical, chart, labs and discussed the procedure including the risks, benefits and alternatives for the proposed anesthesia with the patient or authorized representative who has indicated his/her understanding and acceptance.       Plan Discussed with: Anesthesiologist  Anesthesia Plan Comments:         Anesthesia Quick Evaluation

## 2018-05-14 NOTE — Plan of Care (Signed)
  Problem: Activity: Goal: Ability to tolerate increased activity will improve Outcome: Progressing   Problem: Pain Managment: Goal: General experience of comfort will improve Outcome: Progressing   Problem: Elimination: Goal: Will not experience complications related to bowel motility Outcome: Progressing   Problem: Education: Goal: Knowledge of General Education information will improve Description Including pain rating scale, medication(s)/side effects and non-pharmacologic comfort measures Outcome: Progressing

## 2018-05-14 NOTE — H&P (Signed)
Obstetric History and Physical  Sandy Jenkins is a 28 y.o. G3P1011 with IUP at [redacted]w[redacted]d presenting with leaking fluid and regular contractions. Patient states she has been having  regular, every 2-3 minutes contractions, none vaginal bleeding, ruptured, clear fluid membranes, with active fetal movement.    Prenatal Course Source of Care: [facility]  Pregnancy complications or risks:elevated BMI  Prenatal labs and studies: ABO, Rh: --/--/A POS (02/01 0340) Antibody: NEG (02/01 0340) Rubella: <0.90 (08/09 0948) RPR: Non Reactive (11/19 0944)  HBsAg: Negative (08/09 0948)  HIV: Non Reactive (08/09 0948)  SFK:CLEXNTZG (01/14 0908) 1 hr Glucola  normal Genetic screening normal Anatomy US normal  Past Medical History:  Diagnosis Date  . Gestational diabetes   . History of miscarriage     History reviewed. No pertinent surgical history.  OB History  Gravida Para Term Preterm AB Living  3 1 1   1 1   SAB TAB Ectopic Multiple Live Births  1     0 1    # Outcome Date GA Lbr Len/2nd Weight Sex Delivery Anes PTL Lv  3 Current           2 Term 02/09/17 [redacted]w[redacted]d  3630 g M Vag-Spont EPI    1 SAB             Social History   Socioeconomic History  . Marital status: Single    Spouse name: Not on file  . Number of children: Not on file  . Years of education: Not on file  . Highest education level: Not on file  Occupational History  . Not on file  Social Needs  . Financial resource strain: Not on file  . Food insecurity:    Worry: Not on file    Inability: Not on file  . Transportation needs:    Medical: Not on file    Non-medical: Not on file  Tobacco Use  . Smoking status: Current Every Day Smoker    Packs/day: 0.50    Years: 8.00    Pack years: 4.00    Types: Cigarettes  . Smokeless tobacco: Never Used  Substance and Sexual Activity  . Alcohol use: No  . Drug use: No  . Sexual activity: Yes    Birth control/protection: None  Lifestyle  . Physical activity:    Days per  week: Not on file    Minutes per session: Not on file  . Stress: Not on file  Relationships  . Social connections:    Talks on phone: Not on file    Gets together: Not on file    Attends religious service: Not on file    Active member of club or organization: Not on file    Attends meetings of clubs or organizations: Not on file    Relationship status: Not on file  Other Topics Concern  . Not on file  Social History Narrative  . Not on file    Family History  Problem Relation Age of Onset  . Diabetes Paternal Grandfather   . Breast cancer Neg Hx   . Ovarian cancer Neg Hx   . Colon cancer Neg Hx     Medications Prior to Admission  Medication Sig Dispense Refill Last Dose  . Prenat w/o A-[id number]-[id number]-FA-DHA (CITRANATAL 90 DHA) 90-1 & 300 MG MISC Take 1 tablet by mouth daily. 30 each 11 Past Week at Unknown time    Allergies  Allergen Reactions  . Penicillins Hives    Review of Systems: Negative except  for what is mentioned in HPI.  Physical Exam: BP (!) 145/89 (BP Location: Right Arm)   Pulse 94   Temp 97.9 F (36.6 C) (Oral)   Resp 16   Ht 5\' 3"  (1.6 m)   Wt 83.9 kg   LMP 08/24/2017 (Exact Date)   SpO2 99%   BMI 32.77 kg/m  GENERAL: Well-developed, well-nourished female in no acute distress.  LUNGS: Clear to auscultation bilaterally.  HEART: Regular rate and rhythm. ABDOMEN: Soft, nontender, nondistended, gravid. EXTREMITIES: Nontender, no edema, 2+ distal pulses. Cervical Exam: Dilation: 10 Dilation Complete Date: 05/14/18 Dilation Complete Time: 1010 Effacement (%): 100 Cervical Position: Anterior Station: Plus 2 Presentation: Vertex Exam by:: LSE FHT:  Baseline rate 139 bpm   Variability moderate  Accelerations present   Decelerations none Contractions: Every 2-3 mins   Pertinent Labs/Studies:   Results for orders placed or performed during the hospital encounter of 05/14/18 (from the past 24 hour(s))  CBC     Status: Abnormal   Collection Time:  05/14/18  3:40 AM  Result Value Ref Range   WBC 12.5 (H) 4.0 - 10.5 K/uL   RBC 3.67 (L) 3.87 - 5.11 MIL/uL   Hemoglobin 11.9 (L) 12.0 - 15.0 g/dL   HCT 16.133.6 (L) 09.636.0 - 04.546.0 %   MCV 91.6 80.0 - 100.0 fL   MCH 32.4 26.0 - 34.0 pg   MCHC 35.4 30.0 - 36.0 g/dL   RDW 40.913.7 81.111.5 - 91.415.5 %   Platelets 345 150 - 400 K/uL   nRBC 0.0 0.0 - 0.2 %  Type and screen [facility]     Status: None   Collection Time: 05/14/18  3:40 AM  Result Value Ref Range   ABO/RH(D) A POS    Antibody Screen NEG    Sample Expiration      05/17/2018 Performed at [facility], [address] [place], [place] [id number]     Assessment : Sandy Jenkins is a 28 y.o. G3P1011 at 6917w6d being admitted for labor.  Plan: Labor: Expectant management.  Induction/Augmentation as needed, per protocol FWB: Reassuring fetal heart tracing.  GBS negative Delivery plan: Hopeful for vaginal delivery  Hosam Mcfetridge, CNM [facility], [facility]

## 2018-05-15 LAB — CBC
HCT: 28 % — ABNORMAL LOW (ref 36.0–46.0)
Hemoglobin: 9.7 g/dL — ABNORMAL LOW (ref 12.0–15.0)
MCH: 32.4 pg (ref 26.0–34.0)
MCHC: 34.6 g/dL (ref 30.0–36.0)
MCV: 93.6 fL (ref 80.0–100.0)
NRBC: 0 % (ref 0.0–0.2)
Platelets: 228 10*3/uL (ref 150–400)
RBC: 2.99 MIL/uL — AB (ref 3.87–5.11)
RDW: 13.9 % (ref 11.5–15.5)
WBC: 9.8 10*3/uL (ref 4.0–10.5)

## 2018-05-15 LAB — RPR: RPR Ser Ql: NONREACTIVE

## 2018-05-15 MED ORDER — DESOGESTREL-ETHINYL ESTRADIOL 0.15-0.02/0.01 MG (21/5) PO TABS: 1.0000 | ORAL_TABLET | Freq: Every day | ORAL | 11 refills | Status: AC

## 2018-05-15 MED ORDER — FUSION PLUS PO CAPS: 1.0000 | ORAL_CAPSULE | Freq: Every day | ORAL | 1 refills | Status: AC

## 2018-05-15 NOTE — Progress Notes (Signed)
Pt discharged with infant.  Discharge instructions, prescriptions and follow up appointment given to and reviewed with pt. Pt verbalized understanding. Escorted out by auxillary. 

## 2018-05-15 NOTE — Discharge Summary (Signed)
Physician Obstetric Discharge Summary  Patient ID: Mickle MalloryBrittney D Trigueros MRN: [id number] DOB/AGE: 1990-09-28 28 y.o.   Date of Admission: 05/14/2018  Date of Discharge:   Admitting Diagnosis: Onset of Labor at 4139w6d  Secondary Diagnosis: none  Mode of Delivery: normal spontaneous vaginal delivery     Discharge Diagnosis: mild anemia   Intrapartum Procedures: epidural   Post partum procedures: none  Complications: none   Brief Hospital Course  Mickle MalloryBrittney D Pote is a [id number] who had a SVD on 05/14/2018;  for further details of this, please refer to the delivey note.  Patient had an uncomplicated postpartum course.  By time of discharge on PPD#2, her pain was controlled on oral pain medications; she had appropriate lochia and was ambulating, voiding without difficulty and tolerating regular diet.  She was deemed stable for discharge to home.      Labs: CBC Latest Ref Rng & Units 05/15/2018 05/14/2018 03/01/2018  WBC 4.0 - 10.5 K/uL 9.8 12.5(H) 10.6  Hemoglobin 12.0 - 15.0 g/dL 1.4(N9.7(L) 11.9(L) 11.2  Hematocrit 36.0 - 46.0 % 28.0(L) 33.6(L) 32.4(L)  Platelets 150 - 400 K/uL 228 345 272   A POS  Physical exam:  Blood pressure 126/87, pulse 81, temperature 97.7 F (36.5 C), temperature source Oral, resp. rate 18, height 5\' 3"  (1.6 m), weight 83.9 kg, last menstrual period 08/24/2017, SpO2 99 %, unknown if currently breastfeeding. General: alert and no distress Lochia: appropriate Abdomen: soft, NT Uterine Fundus: firm Extremities: No evidence of DVT seen on physical exam. No lower extremity edema.  Discharge Instructions: Per After Visit Summary. Activity: Advance as tolerated. Pelvic rest for 6 weeks.  Also refer to After Visit Summary Diet: Regular Medications:  Outpatient follow up:  Postpartum contraception: oral contraceptives (estrogen/progesterone)  Discharged Condition: good  Discharged to: home   Newborn Data: Disposition:home with mother, Pamelia HoitLevi, circumcision planned  after discharge  Apgars: APGAR (1 MIN): 9   APGAR (5 MINS): 10   APGAR (10 MINS):    Baby Feeding: Bottle  Melody Suzan NailerN Shambley, CNM

## 2018-05-16 NOTE — Anesthesia Postprocedure Evaluation (Signed)
Anesthesia Post Note  Patient: Sandy Jenkins  Procedure(s) Performed: AN AD HOC LABOR EPIDURAL  Anesthesia Type: Epidural     Last Vitals: There were no vitals filed for this visit.  Last Pain: There were no vitals filed for this visit.               Cleda Mccreedy Piscitello     Patient not seen before discharge from hospital.  Patient did not respond to telephone call.

## 2018-05-18 ENCOUNTER — Encounter: Payer: Medicaid Other | Admitting: Certified Nurse Midwife

## 2018-06-29 ENCOUNTER — Encounter: Payer: Medicaid Other | Admitting: Obstetrics and Gynecology

## 2021-09-18 ENCOUNTER — Other Ambulatory Visit: Payer: Self-pay

## 2021-09-18 ENCOUNTER — Emergency Department
Admission: EM | Admit: 2021-09-18 | Discharge: 2021-09-18 | Disposition: A | Discharge status: Home / Self Care | Payer: Medicaid Other | Attending: Emergency Medicine | Admitting: Emergency Medicine

## 2021-09-18 ENCOUNTER — Emergency Department: Payer: Medicaid Other

## 2021-09-18 DIAGNOSIS — R109 Unspecified abdominal pain: Secondary | ICD-10-CM | POA: Diagnosis present

## 2021-09-18 DIAGNOSIS — N200 Calculus of kidney: Secondary | ICD-10-CM | POA: Diagnosis not present

## 2021-09-18 LAB — CBC
HCT: 42 % (ref 36.0–46.0)
Hemoglobin: 13.1 g/dL (ref 12.0–15.0)
MCH: 30.5 pg (ref 26.0–34.0)
MCHC: 31.2 g/dL (ref 30.0–36.0)
MCV: 97.9 fL (ref 80.0–100.0)
Platelets: 352 10*3/uL (ref 150–400)
RBC: 4.29 MIL/uL (ref 3.87–5.11)
RDW: 12.7 % (ref 11.5–15.5)
WBC: 7.6 10*3/uL (ref 4.0–10.5)
nRBC: 0 % (ref 0.0–0.2)

## 2021-09-18 LAB — URINALYSIS, ROUTINE W REFLEX MICROSCOPIC
Bilirubin Urine: NEGATIVE
Glucose, UA: NEGATIVE mg/dL
Ketones, ur: NEGATIVE mg/dL
Nitrite: NEGATIVE
Protein, ur: NEGATIVE mg/dL
RBC / HPF: >50 RBC/hpf — ABNORMAL HIGH (ref 0–5)
Specific Gravity, Urine: 1.023 (ref 1.005–1.030)
Squamous Epithelial / HPF: >50 — ABNORMAL HIGH (ref 0–5)
pH: 5 (ref 5.0–8.0)

## 2021-09-18 LAB — BASIC METABOLIC PANEL
Anion gap: 7 (ref 5–15)
BUN: 11 mg/dL (ref 6–20)
CO2: 23 mmol/L (ref 22–32)
Calcium: 9 mg/dL (ref 8.9–10.3)
Chloride: 109 mmol/L (ref 98–111)
Creatinine, Ser: 0.74 mg/dL (ref 0.44–1.00)
GFR, Estimated: >60 mL/min (ref >60)
Glucose, Bld: 128 mg/dL — ABNORMAL HIGH (ref 70–99)
Potassium: 3.9 mmol/L (ref 3.5–5.1)
Sodium: 139 mmol/L (ref 135–145)

## 2021-09-18 LAB — POC URINE PREG, ED: Preg Test, Ur: NEGATIVE

## 2021-09-18 MED ORDER — MORPHINE SULFATE (PF) 4 MG/ML IV SOLN
4.0000 mg | Freq: Once | INTRAVENOUS | Status: AC
  Administered 2021-09-18: 4 mg via INTRAVENOUS
  Filled 2021-09-18: qty 1

## 2021-09-18 MED ORDER — ONDANSETRON HCL 4 MG/2ML IJ SOLN
4.0000 mg | Freq: Once | INTRAMUSCULAR | Status: AC
Start: 2021-09-18 — End: 2021-09-18
  Administered 2021-09-18: 4 mg via INTRAVENOUS
  Filled 2021-09-18: qty 2

## 2021-09-18 MED ORDER — CEPHALEXIN 250 MG/5ML PO SUSR: 500.0000 mg | Freq: Three times a day (TID) | ORAL | 0 refills | Status: AC

## 2021-09-18 MED ORDER — KETOROLAC TROMETHAMINE 30 MG/ML IJ SOLN
15.0000 mg | Freq: Once | INTRAMUSCULAR | Status: DC
  Filled 2021-09-18: qty 1

## 2021-09-18 MED ORDER — HYDROCODONE-ACETAMINOPHEN 7.5-325 MG/15ML PO SOLN: 15.0000 mL | Freq: Four times a day (QID) | ORAL | 0 refills | Status: DC | PRN

## 2021-09-18 MED ORDER — SODIUM CHLORIDE 0.9 % IV SOLN
1.0000 g | Freq: Once | INTRAVENOUS | Status: AC
  Administered 2021-09-18: 1 g via INTRAVENOUS
  Filled 2021-09-18: qty 10

## 2021-09-18 NOTE — ED Triage Notes (Signed)
Pt c/o right flank pain since yesterday with nausea and a hx of kidney stone.

## 2021-09-18 NOTE — ED Notes (Signed)
30 yof with a c/c of right sided flank pain since last night. The pt is also c/o vomiting.
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
## 2021-11-26 ENCOUNTER — Ambulatory Visit
Admission: EM | Admit: 2021-11-26 | Discharge: 2021-11-26 | Disposition: A | Discharge status: Home / Self Care | Payer: Medicaid Other | Attending: Emergency Medicine | Admitting: Emergency Medicine

## 2021-11-26 ENCOUNTER — Encounter: Payer: Self-pay | Admitting: Emergency Medicine

## 2021-11-26 DIAGNOSIS — S39012A Strain of muscle, fascia and tendon of lower back, initial encounter: Secondary | ICD-10-CM

## 2021-11-26 MED ORDER — BACLOFEN 1 MG/ML ORAL SUSPENSION: 10.0000 mg | Freq: Three times a day (TID) | ORAL | 0 refills | Status: AC
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
## 2021-11-26 NOTE — Discharge Instructions (Signed)
Take the ibuprofen, 600 mg every 6 hours with food, on a schedule for the next 48 hours and then as needed. This si 30 mL in childrens liquid Motrin.  Take the baclofen, 10 mg every 8 hours, on a schedule for the next 48 hours and then as needed.  Apply moist heat to your back for 30 minutes at a time 2-3 times a day to improve blood flow to the area and help remove the lactic acid causing the spasm.  Follow the back exercises given at discharge.  Return for reevaluation for any new or worsening symptoms.

## 2022-01-23 ENCOUNTER — Encounter: Payer: Self-pay | Admitting: Obstetrics and Gynecology

## 2022-12-21 IMAGING — CT CT RENAL STONE PROTOCOL
3 of 4 series · 9 of 46 positions shown, 16 images · non-contrast
Comparison: None Available.

CLINICAL DATA: Right flank pain with nausea.



[Series 4: coronal · coronal · 0.85mm/px · 3 of 135 slices shown, 4 images]
[im 45/135  soft-tissue]
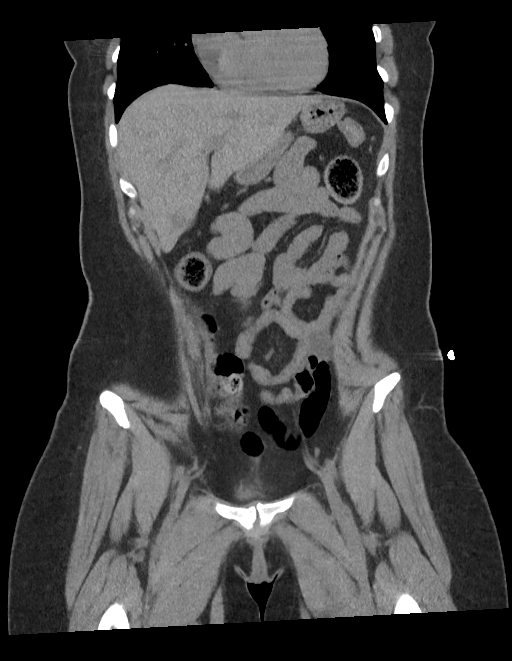
[im 60/135  soft-tissue]
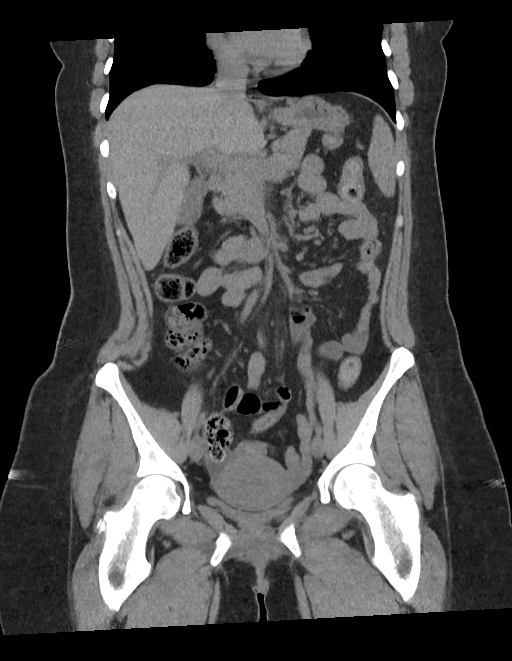
[im 60/135  bone]
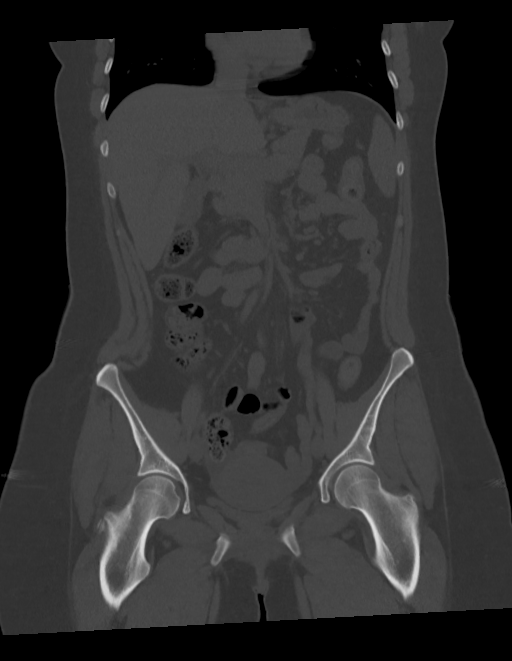
[im 75/135  soft-tissue]
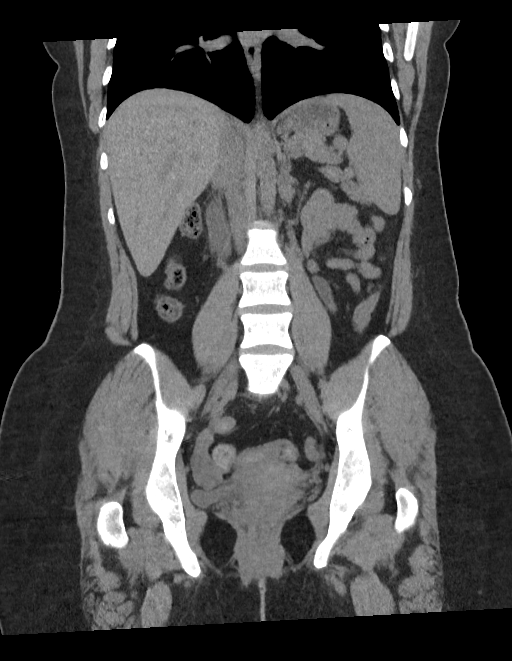

[Series 5: sagittal · sagittal · 0.61mm/px · 1 of 195 slices shown, 2 images]
[im 65/195  soft-tissue]
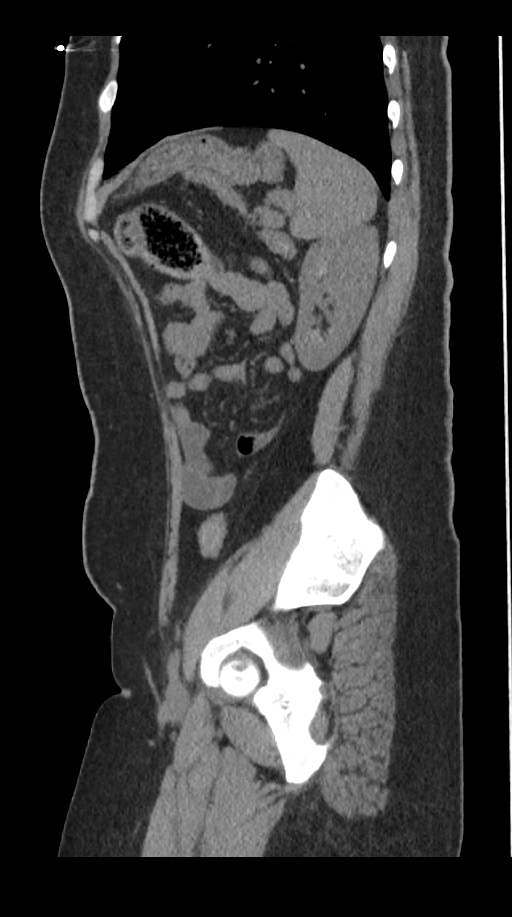
[im 65/195  bone]
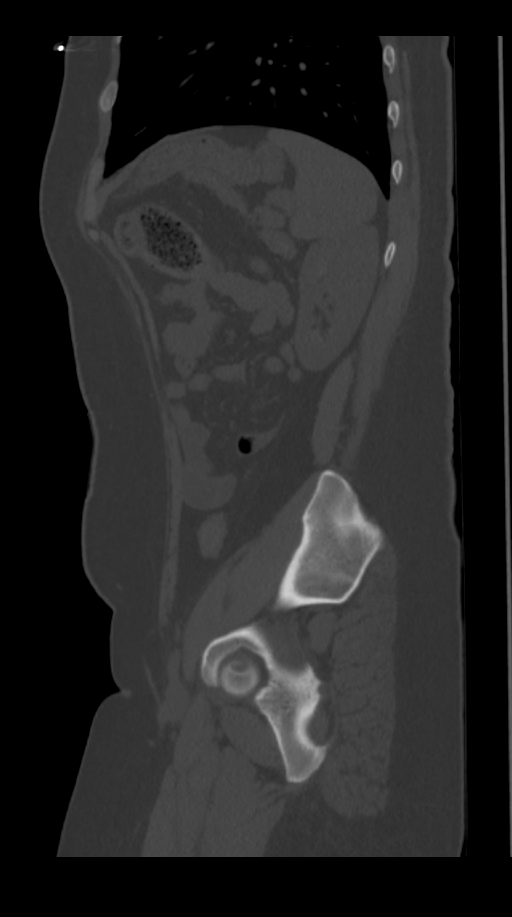

[Series 6: lung bases · axial · 0.63mm/px · z∈[-232,-147]mm · 5 of 27 slices shown, 10 images]
[im 5/27  soft-tissue]
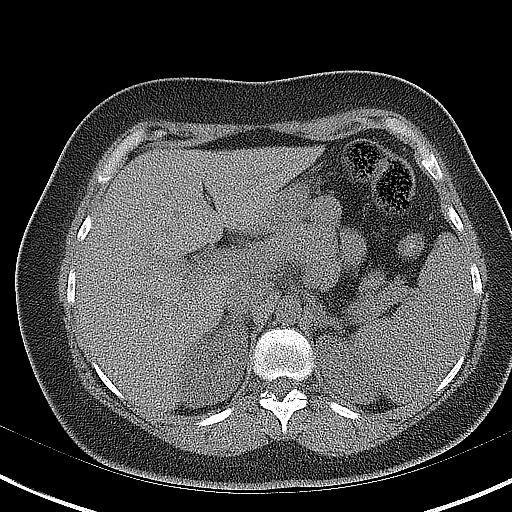
[im 5/27  bone]
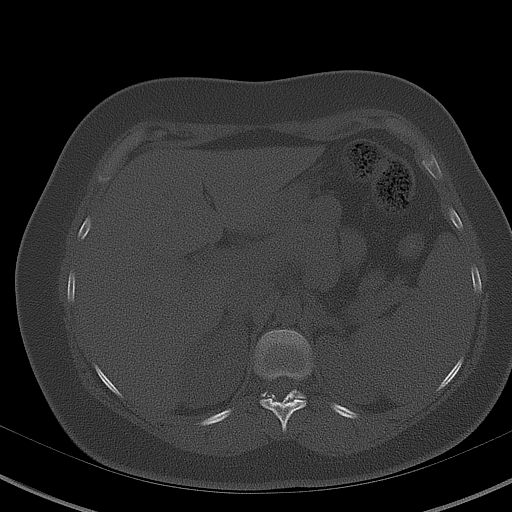
[im 9/27  soft-tissue]
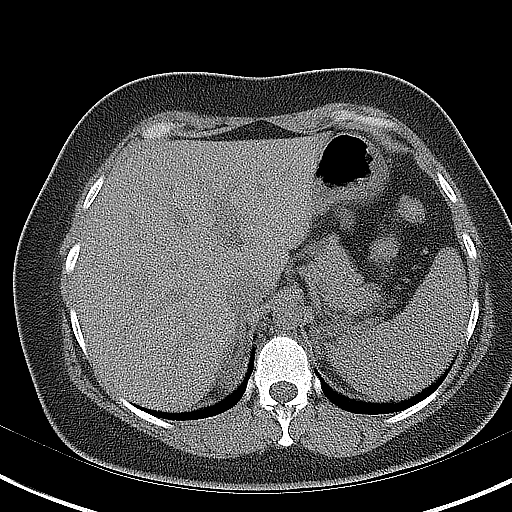
[im 9/27  lung]
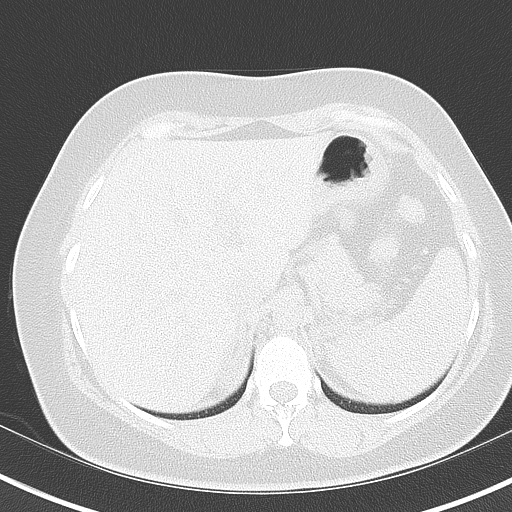
[im 14/27  soft-tissue]
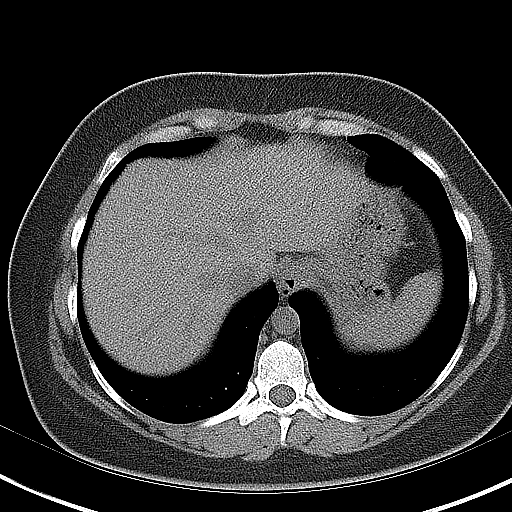
[im 14/27  lung]
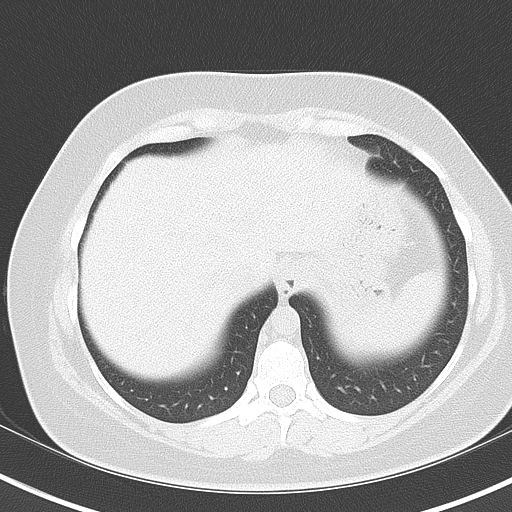
[im 18/27  soft-tissue]
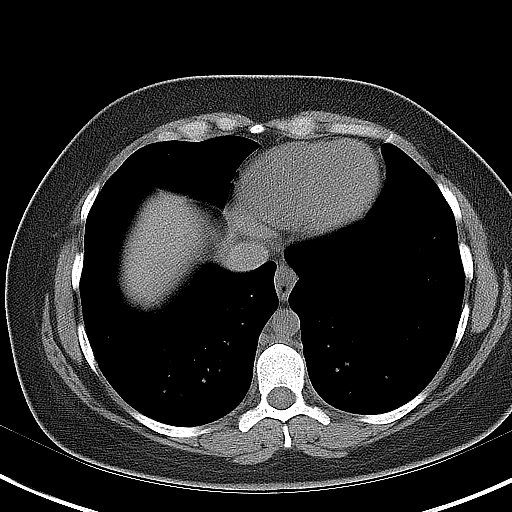
[im 18/27  lung]
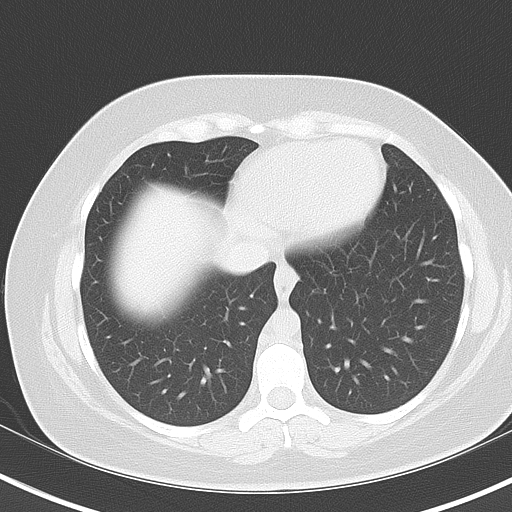
[im 22/27  soft-tissue]
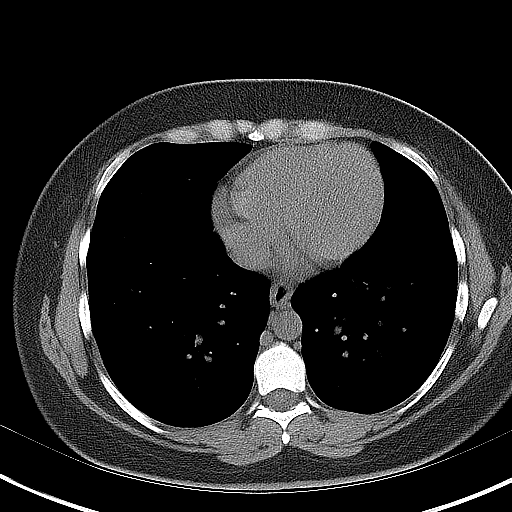
[im 22/27  lung]
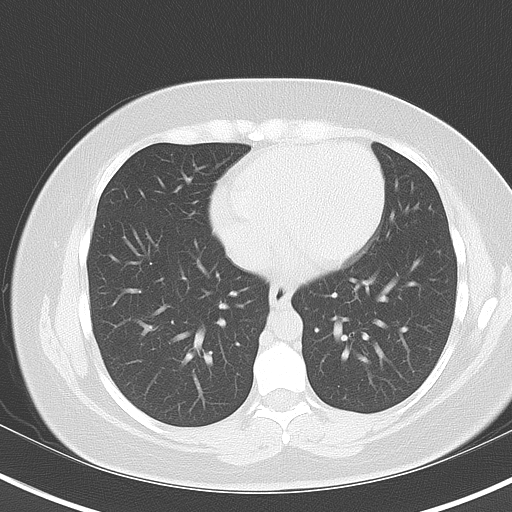

[9 of 46 positions shown; findings below may reference images not displayed]

FINDINGS: Lower chest: No acute abnormality.

Hepatobiliary: No focal liver abnormality is seen. No gallstones,
gallbladder wall thickening, or biliary dilatation.

Pancreas: Unremarkable. No pancreatic ductal dilatation or
surrounding inflammatory changes.

Spleen: Normal in size without focal abnormality.

Adrenals/Urinary Tract: Normal adrenal glands. Left kidney appears
within normal limits. There is low attenuation edema of the right
kidney with mild hydronephrosis and hydroureter. There is a tiny
stone within the bladder in the expected location of the right UVJ
which measures 2 mm, image 80/2 and image 71/4. No left-sided
nephrolithiasis or hydronephrosis. No left hydroureter or ureteral
lithiasis.

Stomach/Bowel: Small hiatal hernia. The appendix is visualized and
appears normal. No pathologic dilatation of the large or small bowel
loops. Assessment of bowel pathology is limited by lack of IV and
enteric contrast material. Intramural fatty deposition is noted
involving the ascending colon which may be seen in the setting of
chronic colitis. Additionally, there is nonspecific mild diffuse
wall thickening of the colon without surrounding inflammatory fat
stranding.

Vascular/Lymphatic: No significant vascular findings are present. No
enlarged abdominal or pelvic lymph nodes.

Reproductive: Uterus and bilateral adnexa are unremarkable.

Other: No significant free fluid or fluid collections. No signs of
pneumoperitoneum.

Musculoskeletal: No acute or significant osseous findings.
IMPRESSION: 1. Mild right-sided hydronephrosis and hydroureter secondary to a 2
mm stone within the bladder in the expected location of the right
UVJ.
2. Nonspecific, mild diffuse wall thickening of the colon without
surrounding inflammatory fat stranding. Additionally, there is
intramural fatty deposition involving the ascending colon which may
be seen in the setting of chronic colitis. Correlate for any
clinical signs or symptoms of acute or chronic colitis.
3. Small hiatal hernia.
